# Patient Record
Sex: Female | Born: 1955 | Race: White | Hispanic: No | Marital: Married | Smoking: Never smoker
Health system: Southern US, Community
[De-identification: ages and names within clinical notes are randomized; demographics above are authoritative.]

## PROBLEM LIST (undated history)

## (undated) DIAGNOSIS — I10 Essential (primary) hypertension: Secondary | ICD-10-CM

## (undated) DIAGNOSIS — H269 Unspecified cataract: Secondary | ICD-10-CM

## (undated) DIAGNOSIS — M1990 Unspecified osteoarthritis, unspecified site: Secondary | ICD-10-CM

## (undated) HISTORY — PX: REPLACEMENT TOTAL HIP W/  RESURFACING IMPLANTS: SUR1222

## (undated) HISTORY — DX: Unspecified cataract: H26.9

## (undated) HISTORY — DX: Essential (primary) hypertension: I10

---

## 1999-08-25 HISTORY — PX: ABDOMINAL HYSTERECTOMY: SHX81

## 2003-01-26 ENCOUNTER — Emergency Department (HOSPITAL_COMMUNITY): Admission: EM | Admit: 2003-01-26 | Discharge: 2003-01-26 | Payer: Self-pay | Admitting: Emergency Medicine

## 2003-01-26 ENCOUNTER — Encounter: Payer: Self-pay | Admitting: Emergency Medicine

## 2008-03-23 ENCOUNTER — Ambulatory Visit: Payer: Self-pay | Admitting: Cardiology

## 2009-03-27 ENCOUNTER — Emergency Department (HOSPITAL_COMMUNITY): Admission: EM | Admit: 2009-03-27 | Discharge: 2009-03-27 | Payer: Self-pay | Admitting: Emergency Medicine

## 2010-10-21 ENCOUNTER — Encounter (HOSPITAL_COMMUNITY): Payer: 59

## 2010-10-21 ENCOUNTER — Other Ambulatory Visit: Payer: Self-pay | Admitting: Podiatry

## 2010-10-21 ENCOUNTER — Ambulatory Visit (HOSPITAL_COMMUNITY)
Admission: RE | Admit: 2010-10-21 | Discharge: 2010-10-21 | Disposition: A | Discharge status: Home / Self Care | Payer: 59 | Source: Ambulatory Visit | Attending: Podiatry | Admitting: Podiatry

## 2010-10-21 ENCOUNTER — Other Ambulatory Visit (HOSPITAL_COMMUNITY): Payer: Self-pay | Admitting: Podiatry

## 2010-10-21 DIAGNOSIS — M21079 Valgus deformity, not elsewhere classified, unspecified ankle: Secondary | ICD-10-CM

## 2010-10-21 DIAGNOSIS — M2010 Hallux valgus (acquired), unspecified foot: Secondary | ICD-10-CM | POA: Insufficient documentation

## 2010-10-21 DIAGNOSIS — M2040 Other hammer toe(s) (acquired), unspecified foot: Secondary | ICD-10-CM | POA: Insufficient documentation

## 2010-10-21 DIAGNOSIS — Z01818 Encounter for other preprocedural examination: Secondary | ICD-10-CM | POA: Insufficient documentation

## 2010-10-21 DIAGNOSIS — Z01810 Encounter for preprocedural cardiovascular examination: Secondary | ICD-10-CM | POA: Insufficient documentation

## 2010-10-21 LAB — BASIC METABOLIC PANEL
GFR calc non Af Amer: >60 mL/min (ref >60)
Potassium: 3.8 mEq/L (ref 3.5–5.1)
Sodium: 140 mEq/L (ref 135–145)

## 2010-10-21 LAB — SURGICAL PCR SCREEN
MRSA, PCR: NEGATIVE
Staphylococcus aureus: NEGATIVE

## 2010-10-21 LAB — HEMOGLOBIN AND HEMATOCRIT, BLOOD
HCT: 39.7 % (ref 36.0–46.0)
Hemoglobin: 13.2 g/dL (ref 12.0–15.0)

## 2010-10-23 ENCOUNTER — Ambulatory Visit (HOSPITAL_COMMUNITY): Payer: 59

## 2010-10-23 ENCOUNTER — Other Ambulatory Visit: Payer: Self-pay

## 2010-10-23 ENCOUNTER — Ambulatory Visit (HOSPITAL_COMMUNITY)
Admission: RE | Admit: 2010-10-23 | Discharge: 2010-10-23 | Disposition: A | Discharge status: Home / Self Care | Payer: 59 | Source: Ambulatory Visit | Attending: Podiatry | Admitting: Podiatry

## 2010-10-23 DIAGNOSIS — M2040 Other hammer toe(s) (acquired), unspecified foot: Secondary | ICD-10-CM | POA: Insufficient documentation

## 2010-10-23 DIAGNOSIS — M2010 Hallux valgus (acquired), unspecified foot: Secondary | ICD-10-CM | POA: Insufficient documentation

## 2010-10-23 DIAGNOSIS — Z79899 Other long term (current) drug therapy: Secondary | ICD-10-CM | POA: Insufficient documentation

## 2010-10-23 DIAGNOSIS — I10 Essential (primary) hypertension: Secondary | ICD-10-CM | POA: Insufficient documentation

## 2010-10-23 DIAGNOSIS — Z7982 Long term (current) use of aspirin: Secondary | ICD-10-CM | POA: Insufficient documentation

## 2010-12-08 ENCOUNTER — Other Ambulatory Visit (HOSPITAL_COMMUNITY): Payer: Self-pay | Admitting: Podiatry

## 2010-12-08 ENCOUNTER — Other Ambulatory Visit: Payer: Self-pay | Admitting: Podiatry

## 2010-12-08 ENCOUNTER — Encounter (HOSPITAL_COMMUNITY): Payer: 59

## 2010-12-08 ENCOUNTER — Ambulatory Visit (HOSPITAL_COMMUNITY)
Admission: RE | Admit: 2010-12-08 | Discharge: 2010-12-08 | Disposition: A | Discharge status: Home / Self Care | Payer: 59 | Source: Ambulatory Visit | Attending: Podiatry | Admitting: Podiatry

## 2010-12-08 DIAGNOSIS — M21611 Bunion of right foot: Secondary | ICD-10-CM

## 2010-12-08 DIAGNOSIS — Z01812 Encounter for preprocedural laboratory examination: Secondary | ICD-10-CM | POA: Insufficient documentation

## 2010-12-08 DIAGNOSIS — M2010 Hallux valgus (acquired), unspecified foot: Secondary | ICD-10-CM | POA: Insufficient documentation

## 2010-12-08 LAB — SURGICAL PCR SCREEN
MRSA, PCR: NEGATIVE
Staphylococcus aureus: NEGATIVE

## 2010-12-08 LAB — BASIC METABOLIC PANEL
Calcium: 9.3 mg/dL (ref 8.4–10.5)
Creatinine, Ser: 0.63 mg/dL (ref 0.4–1.2)
GFR calc Af Amer: >60 mL/min (ref >60)
GFR calc non Af Amer: >60 mL/min (ref >60)

## 2010-12-08 LAB — HEMOGLOBIN AND HEMATOCRIT, BLOOD
HCT: 37.8 % (ref 36.0–46.0)
Hemoglobin: 12.4 g/dL (ref 12.0–15.0)

## 2010-12-11 ENCOUNTER — Ambulatory Visit (HOSPITAL_COMMUNITY)
Admission: RE | Admit: 2010-12-11 | Discharge: 2010-12-11 | Disposition: A | Discharge status: Home / Self Care | Payer: 59 | Source: Ambulatory Visit | Attending: Podiatry | Admitting: Podiatry

## 2010-12-11 ENCOUNTER — Ambulatory Visit (HOSPITAL_COMMUNITY): Payer: 59

## 2010-12-11 DIAGNOSIS — I10 Essential (primary) hypertension: Secondary | ICD-10-CM | POA: Insufficient documentation

## 2010-12-11 DIAGNOSIS — M2040 Other hammer toe(s) (acquired), unspecified foot: Secondary | ICD-10-CM | POA: Insufficient documentation

## 2010-12-11 DIAGNOSIS — Z79899 Other long term (current) drug therapy: Secondary | ICD-10-CM | POA: Insufficient documentation

## 2010-12-11 DIAGNOSIS — Z7982 Long term (current) use of aspirin: Secondary | ICD-10-CM | POA: Insufficient documentation

## 2010-12-11 DIAGNOSIS — B351 Tinea unguium: Secondary | ICD-10-CM | POA: Insufficient documentation

## 2010-12-11 DIAGNOSIS — M2010 Hallux valgus (acquired), unspecified foot: Secondary | ICD-10-CM | POA: Insufficient documentation

## 2010-12-11 DIAGNOSIS — M21619 Bunion of unspecified foot: Secondary | ICD-10-CM | POA: Insufficient documentation

## 2010-12-24 NOTE — Op Note (Signed)
NAME:  Amy Cook, BACKES             ACCOUNT NO.:  [id number]  MEDICAL RECORD NO.:  [id number]           PATIENT TYPE:  O  LOCATION:  DAYP                          FACILITY:  [facility]  PHYSICIAN:  B. Theola Sequin, MD   DATE OF BIRTH:  10/03/1955  DATE OF PROCEDURE:  12/11/2010 DATE OF DISCHARGE:                              OPERATIVE REPORT   SURGEON:  B. Theola Sequin, MD  ASSISTANT:  Neva Seat  PREOPERATIVE DIAGNOSES: 1. Hallux valgus, right foot. 2. Hammertoe deformity, second digit, right foot. 3. Hammertoe deformity, third digit, left foot. 4. Tailor bunion, right foot. 5. Onychomycosis, right hallux.  POSTOPERATIVE DIAGNOSES: 1. Hallux valgus, right foot. 2. Hammertoe deformity, second digit, right foot. 3. Hammertoe deformity, third digit, left foot. 4. Tailor bunion, right foot. 5. Onychomycosis, right hallux.  PROCEDURES: 1. Austin bunionectomy of the right foot. 2. Hammertoe repair, second digit, right foot. 3. Hammertoe repair, third digit, left foot. 4. Tailor bunionectomy, right foot. 5. Permanent total nail avulsion of the right hallux.  ANESTHESIA:  MAC with local.  HEMOSTASIS:  Pneumatic ankle tourniquet at 250 mmHg.  ESTIMATED BLOOD LOSS:  Minimal (less than 5 mL).  MATERIALS:  Synthes 3.0 cannulated screw x20 mm in length and 2.0 mm Allofix cortical bone pin x2.  PATHOLOGY:  None.  COMPLICATIONS:  None.  PROCEDURE IN DETAIL:  The patient was brought to the operating room and placed on the operative table in supine position.  Pneumatic ankle tourniquets were placed about both ankles.  Both feet were then scrubbed, prepped, and draped in the usual sterile manner.  Both limbs were then anesthetized using a 0.5% Marcaine plain.  The right foot was elevated, exsanguinated, and the pneumatic ankle tourniquet inflated to 250 mmHg.  Attention was directed to the dorsomedial aspect of the right foot where a linear longitudinal incision was made  medial and parallel to the extensor hallucis longus tendon.  Incision was deepened to the subcutaneous tissue down to the level of the first metatarsophalangeal joint.  A linear longitudinal periosteal and capsular incisions were performed.  Periosteal and capsular structures were reflected medially and laterally thus exposing the head of the first metatarsal at the operative site.  Prominent medial eminence was resected using a power bone saw.  Attention was directed to the first interspace via the original skin incision where the tendon of the adductor hallucis muscle was identified and transected as attachment to the base of the proximal phalanx.  Attention was redirected to the medial aspect of the first metatarsal head where a chevron osteotomy was performed.  The capital fragment was distracted and shifted into a more left lateral position and impacted upon the first metatarsal shaft.  Following standard principles and techniques, a 3.0 Synthes cannulated screw was inserted across the osteotomy site with adequate compression noted.  Position of the screw was confirmed with C-arm fluoroscopy.  The remaining medial bone shelf was resected using a power bone saw.  A medial capsulorrhaphy was then performed to the first metatarsophalangeal joint.  The periosteal and capsular structures were reapproximated using 2-0 and 3-0 Vicryl, subcutaneous structure was reapproximated using 4-0  Vicryl, and the skin was reapproximated using 5-0 Monocryl in a subcuticular manner. Incision was reinforced with Steri-Strips.  Attention was directed to the dorsal aspect of the right second toe where a linear longitudinal incision was made.  Incision was deepened down to the level of the proximal interphalangeal joint and distal interphalangeal joints.  A transverse tenotomy and capsulotomy was performed to the level of the proximal interphalangeal joint and also the distal interphalangeal joint.  The  head of the proximal phalanx, base of the middle phalanx, head of the middle phalanx, and base of the distal phalanx were resected using power bone saw.  Following standard principles and techniques, a 2.0 mm Allofix cortical bone pin was inserted across the PIPJ and DIPJ.  Position was confirmed with C-arm fluoroscopy.  The extensor tendon was reapproximated using 4-0 Vicryl. Skin was reapproximated using 5-0 Monocryl in a subcuticular manner. Incision was reinforced with Steri-Strips.  Attention was directed to the dorsal aspect of the fifth metatarsophalangeal joint and a linear longitudinal incision was made overlying the fifth metatarsophalangeal joint.  Dissection was continued deep down to level of the fifth metatarsophalangeal joint.  A linear longitudinal periosteal incision was performed.  Periosteal and capsular structures were reflected medially and laterally thus exposing the head of the fifth metatarsal at the operative site.  Prominent lateral eminence was resected using a power bone saw.  The wound was irrigated with copious amounts of sterile irrigant.  Deep periosteal and capsular structures were reapproximated using 4-0 Vicryl, subcutaneous structure reapproximated using 4-0 Vicryl, and skin reapproximated using 5-0 Monocryl in a subcuticular manner.  Incision was reinforced with Steri- Strips.  Attention was directed to the right great toe where the nail was avulsed.  The nail was inspected.  No spicules were noted to remain. Using 3 applications of 89% phenol and 30 seconds each, a chemical matrixectomy was performed.  Sterile compressive dressing was then applied to the right foot.  Pneumatic ankle tourniquet was deflated and a prompt hyperemic response was noted to all digits of the right foot.  Attention was then directed to the left foot.  The foot was elevated, exsanguinated, and the pneumatic ankle tourniquet was inflated to 250 mmHg.  Attention was directed  to the dorsal aspect of the third digit of the left foot where a linear longitudinal incision was made.  Same procedure was performed to the left third toe that was performed to the right second toe without omission or alteration.  Sterile compressive dressing was applied.  The pneumatic ankle tourniquet was deflated and a prompt hyperemic response was noted to all digits of the left foot.  The patient tolerated all procedures well and was transferred from the operating room to the Postanesthesia Care Unit with vital signs stable and vascular status intact to all digits of both feet.          ______________________________ B. Theola Sequin, MD     BIM/MEDQ  D:  12/11/2010  T:  12/11/2010  Job:  [id number]  Electronically Signed by Rexford Maus  on 12/24/2010 03:12:33 PM

## 2010-12-24 NOTE — Op Note (Signed)
NAME:  Amy Cook, Amy Cook             ACCOUNT NO.:  [id number]  MEDICAL RECORD NO.:  [id number]           PATIENT TYPE:  O  LOCATION:  DAYP                          FACILITY:  [facility]  PHYSICIAN:  B. Theola Sequin, MD   DATE OF BIRTH:  19-Sep-1955  DATE OF PROCEDURE:  10/23/2010 DATE OF DISCHARGE:                              OPERATIVE REPORT   SURGEON:  B. Theola Sequin, MD  ASSISTANT:  Valetta Close.  PREOPERATIVE DIAGNOSES: 1. Hallux valgus, left foot, ICD-9735.0 2. Hammertoe deformity, second digit, left foot, ICD-9 735.4.  POSTOPERATIVE DIAGNOSES: 1. Hallux valgus, left foot, ICD-9735.0 2. Hammertoe deformity, second digit, left foot, ICD-9 735.4.  PROCEDURES: 1. Austin bunionectomy of the left foot, CPT code 29562. 2. Hammertoe repair of the second digit, left foot, CPT code 13086  ANESTHESIA:  MAC with local.  HEMOSTASIS:  Pneumatic ankle tourniquet at 250 mmHg.  ESTIMATED BLOOD LOSS:  Minimal (less than 5 mL).  MATERIALS:  Synthes 3.0 cannulated screw and a 2.0-mm Allofix cortical bone pin.  PATHOLOGY:  Bone from first metatarsal for gross evaluation, left foot.  COMPLICATIONS:  None.  PROCEDURE IN DETAIL:  The patient was brought to the operating room and placed on the operative table in supine position.  Pneumatic ankle tourniquet was placed about the patient's left ankle.  The foot was anesthetized using 0.5% Marcaine plain.  The foot was scrubbed, prepped, and draped in the usual sterile manner.  The limb was elevated and exsanguinated and the pneumatic ankle tourniquet was inflated to e50 mmHg.  Attention was directed to the dorsomedial aspect of the left foot where curvilinear incision was made medial and parallel to the extensor hallucis longus tendon.  Dissection was continued deep down to level of the first metatarsophalangeal joint.  A linear longitudinal periosteal capsular incision was performed.  Periosteal and capsular structures were reflected  medially and laterally, thus exposing the head of the first metatarsal at the operative site.  Prominent medial eminence of the first metatarsal head was resected using a power bone saw. Attention was directed to the first interspace via the original skin incision where the tendon of the adductor hallucis muscle was identified and transected as attachment to the base of the proximal phalanx. Attention was redirected to the medial aspect of the first metatarsal head where a Chevron osteotomy was performed.  Capital fragment was distracted and shifted into more lateral position.  Following standard principles and techniques, a 3.0-mm Synthes cannulated screw was inserted across the osteotomy site with adequate compression noted.  The remaining medial bone shelf was resected using power bone saw.  A medial capsulorrhaphy was then performed and reapproximated using 2-0 Vicryl. Periosteal and capsular structures were reapproximated using 4-0 Vicryl. Subcutaneous structure was reapproximated using 4-0 Vicryl and skin was reapproximated using 5-0 Monocryl in a running subcuticular manner. Incision was reinforced with Steri-Strips.  Attention was directed to the second digit of the left foot where a linear longitudinal incision was performed extending just proximal to the nail fold proximally to the second base of the second base proximal phalanx second digit.  Dissection was continued deep down to level  of the proximal interphalangeal joint.  A transverse tenotomy and capsulotomy was performed.  The corresponding articular surfaces of the head of the proximal phalanx and base of the middle phalanx were resected and passed from the operative field.  Attention was directed to the distal interphalangeal joint.  A transverse tenotomy and capsulotomy was performed.  Articular surface was resected from the head of the middle phalanx and base of the distal phalanx.  A 2.0 Allofix cortical bone pin was  then inserted crossing both the distal interphalangeal joint and proximal interphalangeal joints.  Position of the toe was evaluated and noted to be in good alignment.  Prior to any wound closure, the wound was irrigated with copious amounts of sterile irrigant.  The extensor tendon was reapproximated using 4-0 Vicryl. Subcutaneous structure was reapproximated using 4-0 Vicryl.  Skin was reapproximated using 5-0 Monocryl in a subcuticular manner and reinforced with 4-0 Prolene in horizontal mattress suture technique. Incision was reinforced with Steri-Strips.  A sterile compressive dressing was applied to the left foot.  The pneumatic ankle tourniquet was deflated and prompt hyperemic response was noted to all digits of the left foot.          ______________________________ B. Theola Sequin, MD     BIM/MEDQ  D:  10/23/2010  T:  10/24/2010  Job:  [id number]  Electronically Signed by Rexford Maus  on 12/24/2010 03:12:01 PM

## 2011-01-06 NOTE — Assessment & Plan Note (Signed)
[facility]                       [place] CARDIOLOGY OFFICE NOTE   Amy Cook, Amy Cook                      MRN:          [id number]  DATE:03/23/2008                            DOB:          04/03/1956    Amy Cook was seen in the office today at her request.  She has a  history of coronary disease and recently moved to the [place] area.  We already follow a number of her family members.  She first presented  in 2004 with myocardial infarction and severe left ventricular  dysfunction.  At followup, she was noted to have marked recovery in  systolic function.  She had a cardiac catheterization at the time of her  MI that showed moderate disease.  She subsequently did well, but decided  to go to the [facility] for reassessment.  Her LAD and diagonal  were treated percutaneously and a drug-eluting stents implanted.  She  was continued on Plavix and aspirin and done fine since that procedure.   Contributors to her cardiovascular disease include hypertension and  dyslipidemia.  She took a statin for a while, but discontinued it due to  leg pain and concerned over the possible adverse effects.  Blood  pressure control is good when she takes her medicine, which is not all  the time.   Otherwise, she has been generally healthy.  She has a history of GERD.  Most of her medical interventions have been gynecologic including 3  cesarean sections, hysterectomy in 2001 and a right breast biopsy.   ALLERGIES:  She reports an allergy to MORPHINE.   CURRENT MEDICATIONS:  Clopidogrel 75 mg daily, Toprol 25 mg daily,  Benicar 20 mg daily, fish oil 1000 mg daily, and aspirin 81 mg daily.   SOCIAL HISTORY:  Now works at the [employer] as a Lawyer.  Walks for  exercise.  Divorced with 3 children.   FAMILY HISTORY:  Positive for a fatal case of meningitis in her father  and congestive heart failure in her mother.  She has 8 siblings, only  one of whom  has cardiac issues.   REVIEW OF SYSTEMS:  The need for corrective lenses for near and far  vision.  All other systems reviewed and are negative.   PHYSICAL EXAMINATION:  GENERAL:  On exam, trim pleasant woman in no  acute distress.  VITAL SIGNS:  The weight is 150 pounds, blood pressure 155/95, heart  rate 60 and regular, and respirations 14.  NECK:  No jugular venous distention.  No carotid bruits.  HEENT:  EOMs are full.  Normal lids and conjunctivae.  ENDOCRINE:  No thyromegaly.  HEMATOPOIETIC:  No adenopathy.  LUNGS:  Clear.  BACK:  Straight.  CARDIAC:  Normal first and second heart sounds.  Normal PMI.  ABDOMEN:  Soft and nontender.  No organomegaly.  EXTREMITIES:  No edema.  Normal distal pulses.   No recent laboratory is available   IMPRESSION:  Amy Cook's history is most consistent with Takotsubos as  her initial presentation was associated moderate coronary disease.  Although, I do not have records from the [facility], it  sounds as  if stenting of her coronaries was not absolutely necessary.  Nonetheless, she has continued to do well.  We discussed the pros and  cons of clopidogrel.  Due to expense, she wishes to discontinue this  medication, which is fine with me.  She would like her other medications  to be as inexpensive as possible.  Benicar will be changed to lisinopril  20 mg daily.  We will start pravastatin 20 mg daily.  She is cautioned  to call for any side effects.  A lipid profile and chemistry profile  will be obtained in 1 month with a return visit in 6 months.  We will  seek results of her recent evaluation at [facility].     Amy Friends. Dietrich Pates, MD, [facility]  Electronically Signed    RMR/MedQ  DD: 03/23/2008  DT: 03/24/2008  Job #: [id number]

## 2011-01-09 NOTE — Procedures (Signed)
   Amy [name], HAVENER                         ACCOUNT NO.:  [id number]   MEDICAL RECORD NO.:  [id number]                   PATIENT TYPE:  EMS   LOCATION:  ED                                   FACILITY:  [facility]   PHYSICIAN:  Edward L. Juanetta Gosling, M.D.             DATE OF BIRTH:  [birth year]   DATE OF PROCEDURE:  02/06/2003  DATE OF DISCHARGE:  01/26/2003                                EKG INTERPRETATION   RESULTS:  The rhythm is sinus rhythm with a rate in the 60's.  There are  inferolateral T-wave abnormalities which are nonspecific.  Abnormal  electrocardiogram.                                               Oneal Deputy. Juanetta Gosling, M.D.    ELH/MEDQ  D:  02/06/2003  T:  02/06/2003  Job:  [id number]

## 2011-01-09 NOTE — Procedures (Signed)
   [name], [name]                         ACCOUNT NO.:  [id number]   MEDICAL RECORD NO.:  [id number]                   PATIENT TYPE:  EMS   LOCATION:  ED                                   FACILITY:  [facility]   PHYSICIAN:  Edward L. Juanetta Gosling, M.D.             DATE OF BIRTH:  April 28, 1956   DATE OF PROCEDURE:  DATE OF DISCHARGE:  01/26/2003                                EKG INTERPRETATION   TIME AND DATE:  1847 on 01/26/2003.   INTERPRETATION:  The rhythm is sinus rhythm with a rate in the 60s.  There  are diffuse nonspecific T wave abnormalities.  Slow R wave progression  across the precordium may indicate previous anterior myocardial infarction  and clinical correlation is suggested.   IMPRESSION:  Abnormal electrocardiogram.                                               Edward L. Juanetta Gosling, M.D.    ELH/MEDQ  D:  01/31/2003  T:  02/01/2003  Job:  [id number]

## 2013-01-25 IMAGING — CR DG FOOT COMPLETE 3+V*L*
3 series · 3 of 3 positions shown · non-contrast
Comparison: 10/21/2010.

CLINICAL DATA: Postop bunionectomy.

LEFT FOOT - COMPLETE 3+ VIEW

[view not recorded (1 of 3)]
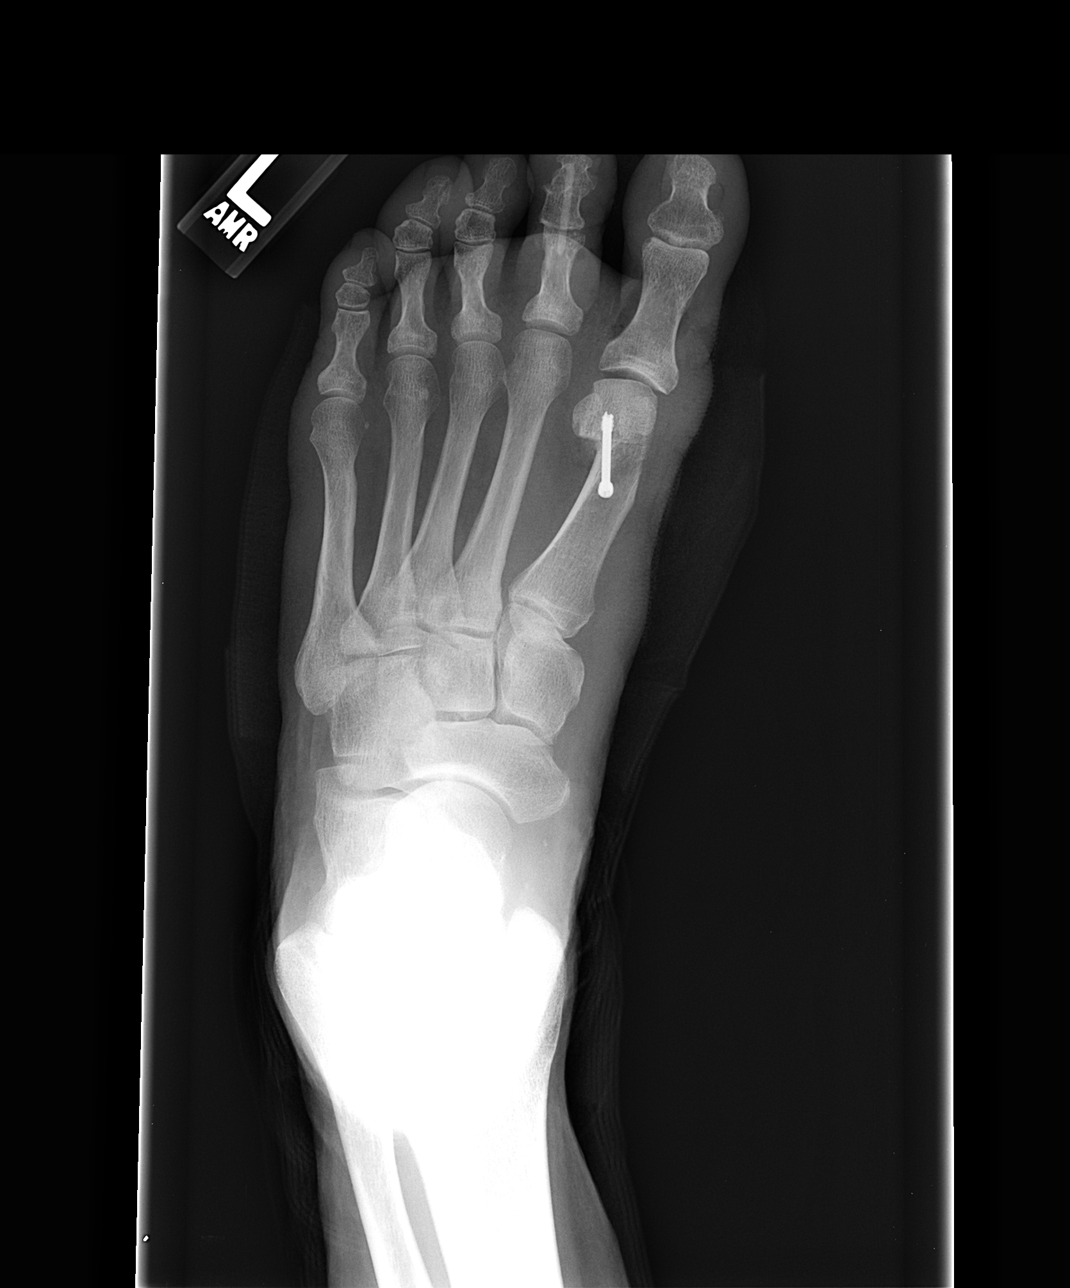

[view not recorded (2 of 3)]
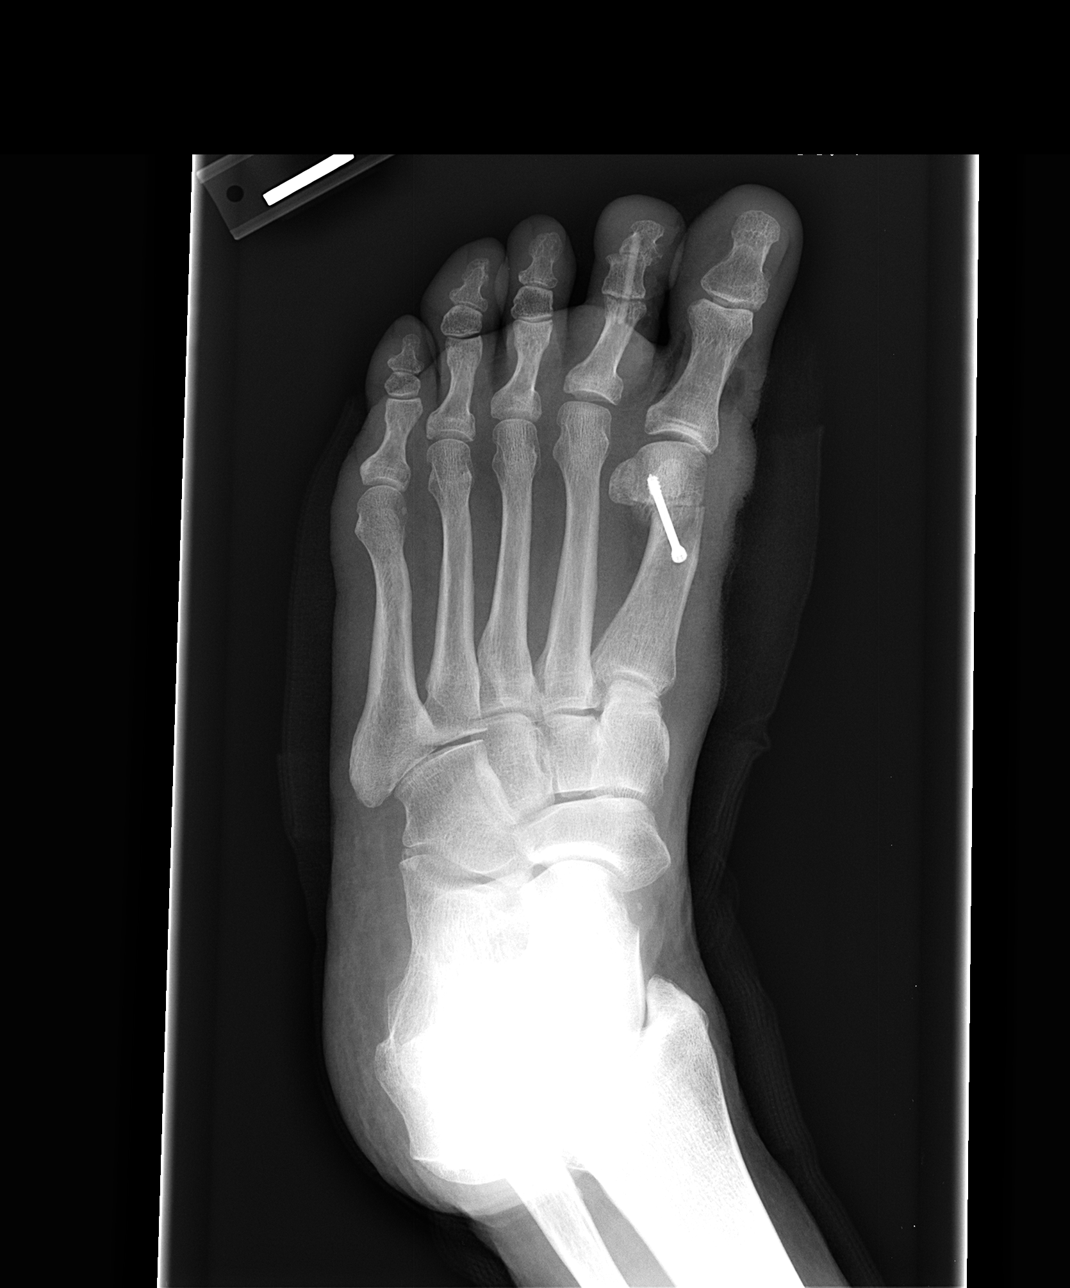

[view not recorded (3 of 3)]
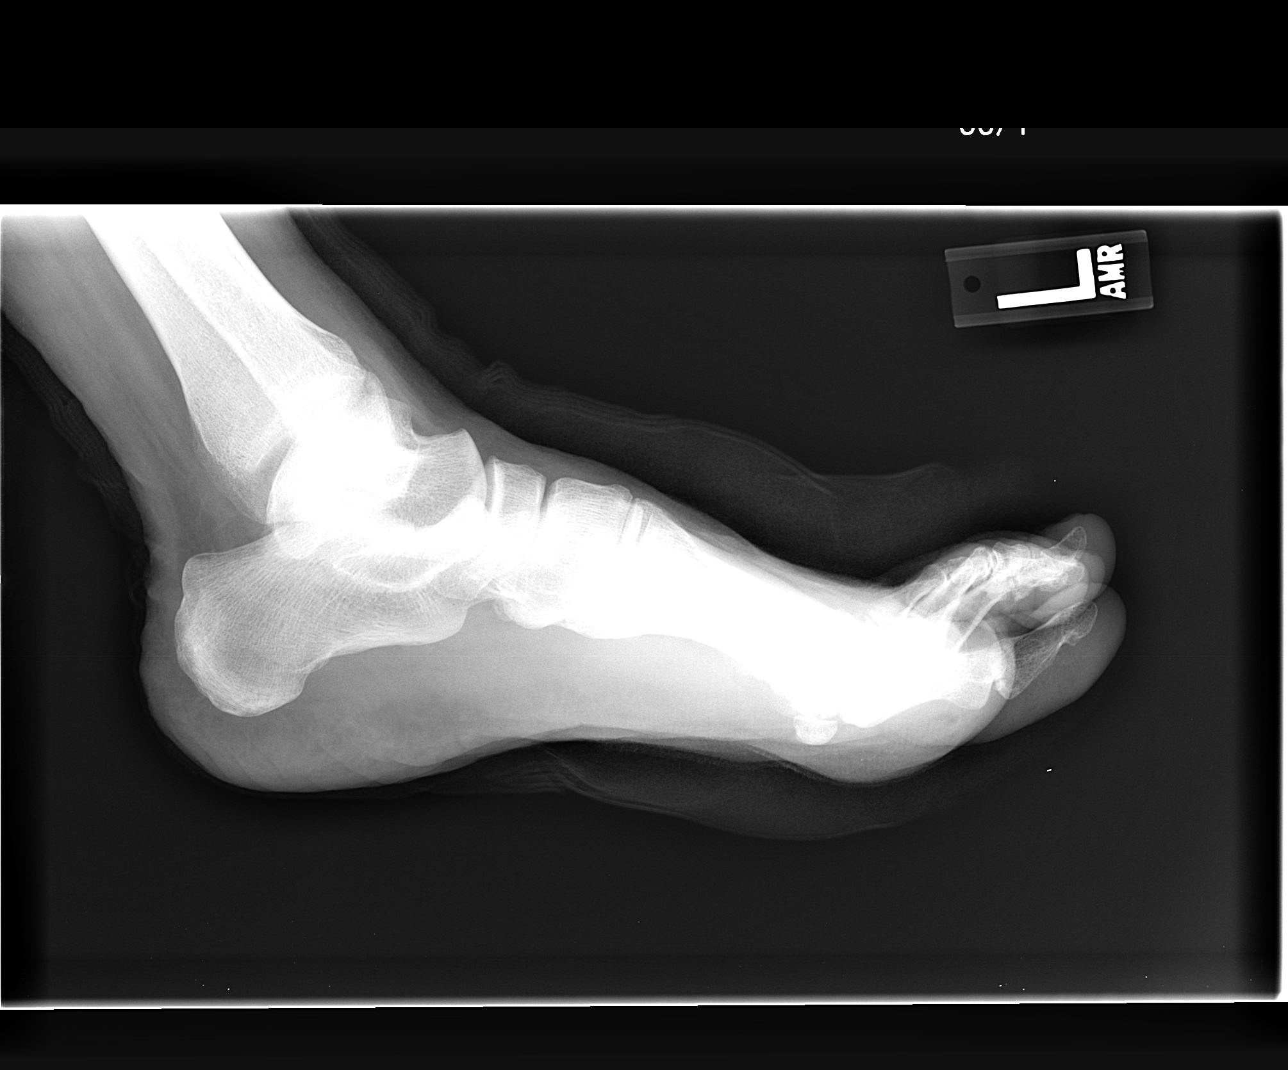

[3 of 3 positions shown; findings below may reference images not displayed]

FINDINGS: There are postsurgical changes status post bunionectomy
and osteotomy of the head of the first metatarsal.  Fixation screw
appears well positioned.  There is no evidence of acute fracture or
dislocation.  The patient appears to have undergone arthrodesis
across the middle and distal phalanges of the second toe as well.
There is some soft tissue emphysema within the great toe.
IMPRESSION: Postsurgical changes as described.  No demonstrated complication.

## 2016-12-22 DIAGNOSIS — H269 Unspecified cataract: Secondary | ICD-10-CM

## 2016-12-22 HISTORY — DX: Unspecified cataract: H26.9

## 2017-04-12 ENCOUNTER — Encounter: Payer: Self-pay | Admitting: Physician Assistant

## 2017-04-12 ENCOUNTER — Ambulatory Visit (INDEPENDENT_AMBULATORY_CARE_PROVIDER_SITE_OTHER): Payer: Self-pay | Admitting: Physician Assistant

## 2017-04-12 VITALS — BP 160/110 | HR 67 | Temp 97.6°F | Resp 18 | Ht 63.0 in | Wt 153.4 lb

## 2017-04-12 DIAGNOSIS — I10 Essential (primary) hypertension: Secondary | ICD-10-CM | POA: Insufficient documentation

## 2017-04-12 DIAGNOSIS — Z0000 Encounter for general adult medical examination without abnormal findings: Secondary | ICD-10-CM

## 2017-04-12 LAB — CBC WITH DIFFERENTIAL/PLATELET
BASOS ABS: 0 {cells}/uL (ref 0–200)
Basophils Relative: 0 %
EOS ABS: 324 {cells}/uL (ref 15–500)
Eosinophils Relative: 6 %
HCT: 39.4 % (ref 35.0–45.0)
Hemoglobin: 13.2 g/dL (ref 12.0–15.0)
LYMPHS PCT: 37 %
Lymphs Abs: 1998 cells/uL (ref 850–3900)
MCH: 29.6 pg (ref 27.0–33.0)
MCHC: 33.5 g/dL (ref 32.0–36.0)
MCV: 88.3 fL (ref 80.0–100.0)
MPV: 9.2 fL (ref 7.5–12.5)
Monocytes Absolute: 378 cells/uL (ref 200–950)
Monocytes Relative: 7 %
NEUTROS ABS: 2700 {cells}/uL (ref 1500–7800)
NEUTROS PCT: 50 %
Platelets: 305 10*3/uL (ref 140–400)
RBC: 4.46 MIL/uL (ref 3.80–5.10)
RDW: 14 % (ref 11.0–15.0)
WBC: 5.4 10*3/uL (ref 3.8–10.8)

## 2017-04-12 LAB — TSH: TSH: 2.45 mIU/L

## 2017-04-12 MED ORDER — AMLODIPINE BESYLATE 5 MG PO TABS: 5.0000 mg | ORAL_TABLET | Freq: Every day | ORAL | 0 refills | Status: DC

## 2017-04-12 NOTE — Progress Notes (Addendum)
Patient ID: Amy Cook MRN: [id number], DOB: [birth year], 61 y.o. Date of Encounter: 04/12/2017,   Chief Complaint: Physical (CPE)  HPI: 61 y.o. y/o female  here for CPE.    She presents as a new patient to our office. Her husband also has an appointment with me today as a new patient to establish care. Also I have been seeing her daughter and her daughter's family (last name Pontis)  I reviewed with her that the daughter and her family moved here from [place] about 1-2 years ago. Asked patient if she had always been living here or had been living in [place] etc. She reports that she had been back and forth between [place] and [place] for many years. She got separated from her first husband in 2003. She moved here in 2008. She got remarried in 2012. Her current husband works in [employer]. He now lives with her in [place]. After her divorce, she moved to [place] where she has multiple siblings and a farm. She has been keeping her grand children--- the ones who are not yet school-age and then also has been keeping all of them during the summer. She stays very active with them. 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  She has no medical insurance. She has seen no medical provider in many years.  The only specific medical issue that she brings up is that she was having symptoms consistent with vaginal atrophy but has been using lubricant and this controls the symptoms. Today I did discuss using an estrogen cream but the fact that this would be more expensive and so if the lubricant is controlling symptoms from a financial standpoint will just continue that.  She has had hysterectomy and bilateral oophorectomy in the past but this was not due to cancer.  She has no other significant history and has no other specific concerns to address today.    Review of Systems: Consitutional: No fever, chills, fatigue, night  sweats, lymphadenopathy. No significant/unexplained weight changes. Eyes: No visual changes, eye redness, or discharge. ENT/Mouth: No ear pain, sore throat, nasal drainage, or sinus pain. Cardiovascular: No chest pressure,heaviness, tightness or squeezing, even with exertion. No increased shortness of breath or dyspnea on exertion.No palpitations, edema, orthopnea, PND. Respiratory: No cough, hemoptysis, SOB, or wheezing. Gastrointestinal: No anorexia, dysphagia, reflux, pain, nausea, vomiting, hematemesis, diarrhea, constipation, BRBPR, or melena. Breast: No mass, nodules, bulging, or retraction. No skin changes or inflammation. No nipple discharge. No lymphadenopathy. Genitourinary: No dysuria, hematuria, incontinence, vaginal discharge, pruritis, burning, abnormal bleeding, or pain. Musculoskeletal: No decreased ROM, No joint pain or swelling. No significant pain in neck, back, or extremities. Skin: No rash, pruritis, or concerning lesions. Neurological: No headache, dizziness, syncope, seizures, tremors, memory loss, coordination problems, or paresthesias. Psychological: No anxiety, depression, hallucinations, SI/HI. Endocrine: No polydipsia, polyphagia, polyuria, or known diabetes.No increased fatigue. No palpitations/rapid heart rate. No significant/unexplained weight change. All other systems were reviewed and are otherwise negative.  Past Medical History:  Diagnosis Date  . Cataract 12/2016   both  . Hypertension      Past Surgical History:  Procedure Laterality Date  . ABDOMINAL HYSTERECTOMY  2001   complete  . CESAREAN SECTION      Home Meds:  No outpatient prescriptions prior to visit.   No facility-administered medications prior to visit.     Allergies: No Known Allergies  Social History   Social History  . Marital  status: Married    Spouse name: N/A  . Number of children: N/A  . Years of education: N/A   Occupational History  . Not on file.   Social  History Main Topics  . Smoking status: Never Smoker  . Smokeless tobacco: Never Used  . Alcohol use No  . Drug use: No  . Sexual activity: Yes   Other Topics Concern  . Not on file   Social History Narrative  . No narrative on file    Family History  Problem Relation Age of Onset  . Depression Mother   . Hypertension Mother   . Hyperlipidemia Mother   . Stroke Mother   . Alcohol abuse Father   . Heart disease Father   . Hypertension Father   . Hyperlipidemia Father   . COPD Sister   . Depression Sister   . Alcohol abuse Brother   . Depression Brother   . Depression Daughter   . Alcohol abuse Maternal Uncle   . Alcohol abuse Paternal Uncle   . Alcohol abuse Brother   . Depression Brother     Physical Exam: Blood pressure (!) 160/110, pulse 67, temperature 97.6 F (36.4 C), temperature source Oral, resp. rate 18, height 5\' 3"  (1.6 m), weight 153 lb 6.4 oz (69.6 kg), SpO2 98 %., Body mass index is 27.17 kg/m. General: Well developed, well nourished WF. Appears in no acute distress. HEENT: Normocephalic, atraumatic. Conjunctiva pink, sclera non-icteric. Pupils 2 mm constricting to 1 mm, round, regular, and equally reactive to light and accomodation. EOMI. Internal auditory canal clear. TMs with good cone of light and without pathology. Nasal mucosa pink. Nares are without discharge. No sinus tenderness. Oral mucosa pink.  Pharynx without exudate.   Neck: Supple. Trachea midline. No thyromegaly. Full ROM. No lymphadenopathy.No Carotid Bruits. Lungs: Clear to auscultation bilaterally without wheezes, rales, or rhonchi. Breathing is of normal effort and unlabored. Cardiovascular: RRR with S1 S2. No murmurs, rubs, or gallops. Distal pulses 2+ symmetrically. No carotid or abdominal bruits. Breast: Symmetrical. No masses. Nipples without discharge. Abdomen: Soft, non-tender, non-distended with normoactive bowel sounds. No hepatosplenomegaly or masses. No rebound/guarding. No CVA  tenderness. No hernias.  Genitourinary: She defers. H/O Hysterectomy and Bilateral Oophorectomy Musculoskeletal: Full range of motion and 5/5 strength throughout.  Skin: Warm and moist without erythema, ecchymosis, wounds, or rash. Neuro: A+Ox3. CN II-XII grossly intact. Moves all extremities spontaneously. Full sensation throughout. Normal gait.  Psych:  Responds to questions appropriately with a normal affect.   Assessment/Plan:  61 y.o. y/o female here for CPE  --------SELF PAY---------NO INSURANCE------------------------ 1. Encounter for medical examination to establish care  2. Encounter for preventive health examination  A. Screening Labs: She is fasting. Did discuss cost. Lab will mark as self pay to see if lab will give her a price deduction. - CBC with Differential/Platelet - COMPLETE METABOLIC PANEL WITH GFR - Lipid panel - TSH   B. Pap: She has had hysterectomy and this was not secondary to cancer so she has no indication for any further Pap smear.  C. Screening Mammogram: Today I did give her a coupon for mammogram for $99 at [facility].  D. DEXA/BMD:  She defers this secondary to cost.  E. Colorectal Cancer Screening: Discussed recommendations for screening for colorectal cancer. Discussed that the best test for this is colonoscopy. She defers this secondary to cost. Also discussed Hemoccult test. However she defers this as well. She does report that there is no family history of colorectal cancer.  We have discussed risk and benefits of the test but she still defers.  F. Immunizations:  Influenza: N/A it is August Tetanus: She reports that she did receive tetanus vaccine at work last year. Pneumococcal: She has no indication to require pneumonia vaccine until age 46. Shingrix: Defers secondary to cost.   3. Essential hypertension Her blood pressure is elevated. I will use Norvasc as this medication will be an expensive and will not require routine lab monitoring.  She is to start this medication daily and return for follow-up visit in 2 weeks to recheck blood pressure on medication. Once the dose is stable, will change Rx to #90. - amLODipine (NORVASC) 5 MG tablet; Take 1 tablet (5 mg total) by mouth daily.  Dispense: 30 tablet; Refill: 0   Signed, [address] [place], [place], [facility] 04/12/2017 9:19 AM

## 2017-04-13 LAB — COMPLETE METABOLIC PANEL WITH GFR
ALT: 15 U/L (ref 6–29)
AST: 17 U/L (ref 10–35)
Albumin: 4.7 g/dL (ref 3.6–5.1)
Alkaline Phosphatase: 98 U/L (ref 33–130)
BILIRUBIN TOTAL: 0.4 mg/dL (ref 0.2–1.2)
BUN: 10 mg/dL (ref 7–25)
CO2: 27 mmol/L (ref 20–32)
Calcium: 9.7 mg/dL (ref 8.6–10.4)
Chloride: 102 mmol/L (ref 98–110)
Creat: 0.65 mg/dL (ref 0.50–0.99)
Glucose, Bld: 90 mg/dL (ref 70–99)
Potassium: 4.5 mmol/L (ref 3.5–5.3)
Sodium: 139 mmol/L (ref 135–146)
TOTAL PROTEIN: 7.6 g/dL (ref 6.1–8.1)

## 2017-04-13 LAB — LIPID PANEL
Cholesterol: 235 mg/dL — ABNORMAL HIGH (ref <200)
HDL: 73 mg/dL (ref >50)
LDL Cholesterol: 142 mg/dL — ABNORMAL HIGH (ref <100)
TRIGLYCERIDES: 100 mg/dL (ref <150)
Total CHOL/HDL Ratio: 3.2 Ratio (ref <5.0)
VLDL: 20 mg/dL (ref <30)

## 2017-04-28 ENCOUNTER — Ambulatory Visit (INDEPENDENT_AMBULATORY_CARE_PROVIDER_SITE_OTHER): Payer: Self-pay | Admitting: Physician Assistant

## 2017-04-28 ENCOUNTER — Telehealth: Payer: Self-pay | Admitting: Physician Assistant

## 2017-04-28 ENCOUNTER — Encounter: Payer: Self-pay | Admitting: Physician Assistant

## 2017-04-28 VITALS — BP 160/88 | HR 60 | Temp 97.6°F | Resp 14 | Ht 63.0 in | Wt 157.0 lb

## 2017-04-28 DIAGNOSIS — I10 Essential (primary) hypertension: Secondary | ICD-10-CM

## 2017-04-28 MED ORDER — AMLODIPINE BESYLATE 10 MG PO TABS: 10.0000 mg | ORAL_TABLET | Freq: Every day | ORAL | 0 refills | Status: DC

## 2017-04-28 MED ORDER — AMLODIPINE BESYLATE 10 MG PO TABS: 10.0000 mg | ORAL_TABLET | Freq: Every day | ORAL | 1 refills | Status: DC

## 2017-04-28 NOTE — Telephone Encounter (Signed)
Medication called/sent to requested pharmacy  

## 2017-04-28 NOTE — Progress Notes (Signed)
Patient ID: Amy Cook MRN: [id number], DOB: 1955-12-31, 61 y.o. Date of Encounter: 04/28/2017,   Chief Complaint: Hypertension  HPI: 61 y.o. y/o female  here for f/u of Hypertension.   04/12/2017: She presents as a new patient to our office. Her husband also has an appointment with me today as a new patient to establish care. Also I have been seeing her daughter and her daughter's family (last name Pontis)  I reviewed with her that the daughter and her family moved here from [place] about 1-2 years ago. Asked patient if she had always been living here or had been living in [place] etc. She reports that she had been back and forth between [place] and [place] for many years. She got separated from her first husband in 2003. She moved here in 2008. She got remarried in 2012. Her current husband works in [employer]. He now lives with her in [place]. After her divorce, she moved to [place] where she has multiple siblings and a farm. She has been keeping her grand children--- the ones who are not yet school-age and then also has been keeping all of them during the summer. She stays very active with them. 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  She has no medical insurance. She has seen no medical provider in many years.  The only specific medical issue that she brings up is that she was having symptoms consistent with vaginal atrophy but has been using lubricant and this controls the symptoms. Today I did discuss using an estrogen cream but the fact that this would be more expensive and so if the lubricant is controlling symptoms from a financial standpoint will just continue that.  She has had hysterectomy and bilateral oophorectomy in the past but this was not due to cancer.  She has no other significant history and has no other specific concerns to address today.  AT THAT OV 04/12/2017: Discussed Preventive Care. Did  screening labs.  BP was elevated at 160/110. Started Norvasc 5mg  QD.    04/28/2017: Today patient reports that she has been taking the Norvasc 5 mg daily. She is having no lower extremity edema. Having no other adverse effects. States that she has checked her blood pressure at home and has gotten around 160/90. No other concerns to address today.    Review of Systems: Consitutional: No fever, chills, fatigue, night sweats, lymphadenopathy. No significant/unexplained weight changes. Eyes: No visual changes, eye redness, or discharge. ENT/Mouth: No ear pain, sore throat, nasal drainage, or sinus pain. Cardiovascular: No chest pressure,heaviness, tightness or squeezing, even with exertion. No increased shortness of breath or dyspnea on exertion.No palpitations, edema, orthopnea, PND. Respiratory: No cough, hemoptysis, SOB, or wheezing. Gastrointestinal: No anorexia, dysphagia, reflux, pain, nausea, vomiting, hematemesis, diarrhea, constipation, BRBPR, or melena. Breast: No mass, nodules, bulging, or retraction. No skin changes or inflammation. No nipple discharge. No lymphadenopathy. Genitourinary: No dysuria, hematuria, incontinence, vaginal discharge, pruritis, burning, abnormal bleeding, or pain. Musculoskeletal: No decreased ROM, No joint pain or swelling. No significant pain in neck, back, or extremities. Skin: No rash, pruritis, or concerning lesions. Neurological: No headache, dizziness, syncope, seizures, tremors, memory loss, coordination problems, or paresthesias. Psychological: No anxiety, depression, hallucinations, SI/HI. Endocrine: No polydipsia, polyphagia, polyuria, or known diabetes.No increased fatigue. No palpitations/rapid heart rate. No significant/unexplained weight change. All other systems were reviewed and are otherwise negative.  Past Medical History:  Diagnosis Date  . Cataract  12/2016   both  . Hypertension      Past Surgical History:  Procedure Laterality  Date  . ABDOMINAL HYSTERECTOMY  2001   complete  . CESAREAN SECTION      Home Meds:  Outpatient Medications Prior to Visit  Medication Sig Dispense Refill  . amLODipine (NORVASC) 5 MG tablet Take 1 tablet (5 mg total) by mouth daily. 30 tablet 0   No facility-administered medications prior to visit.     Allergies: No Known Allergies  Social History   Social History  . Marital status: Married    Spouse name: N/A  . Number of children: N/A  . Years of education: N/A   Occupational History  . Not on file.   Social History Main Topics  . Smoking status: Never Smoker  . Smokeless tobacco: Never Used  . Alcohol use No  . Drug use: No  . Sexual activity: Yes   Other Topics Concern  . Not on file   Social History Narrative  . No narrative on file    Family History  Problem Relation Age of Onset  . Depression Mother   . Hypertension Mother   . Hyperlipidemia Mother   . Stroke Mother   . Alcohol abuse Father   . Heart disease Father   . Hypertension Father   . Hyperlipidemia Father   . COPD Sister   . Depression Sister   . Alcohol abuse Brother   . Depression Brother   . Depression Daughter   . Alcohol abuse Maternal Uncle   . Alcohol abuse Paternal Uncle   . Alcohol abuse Brother   . Depression Brother     Physical Exam: Blood pressure (!) 160/88, pulse 60, temperature 97.6 F (36.4 C), temperature source Oral, resp. rate 14, height 5\' 3"  (1.6 m), weight 157 lb (71.2 kg), SpO2 98 %., Body mass index is 27.81 kg/m. General: Well developed, well nourished WF. Appears in no acute distress. Neck: Supple. Trachea midline. No thyromegaly. Full ROM. No lymphadenopathy.No Carotid Bruits. Lungs: Clear to auscultation bilaterally without wheezes, rales, or rhonchi. Breathing is of normal effort and unlabored. Cardiovascular: RRR with S1 S2. No murmurs, rubs, or gallops. Distal pulses 2+ symmetrically. No carotid or abdominal bruits. Musculoskeletal: Full range of  motion and 5/5 strength throughout.  Skin/Extremities: No LE edema. Neuro: A+Ox3. CN II-XII grossly intact. Moves all extremities spontaneously. Full sensation throughout. Normal gait.  Psych:  Responds to questions appropriately with a normal affect.   Assessment/Plan:  61 y.o. y/o female here for CPE  --------SELF PAY---------NO INSURANCE------------------------  Essential hypertension 04/12/2017:  Her blood pressure is elevated.           I will use Norvasc as this medication will be an expensive and will not require routine lab monitoring.           She is to start this medication daily and return for follow-up visit in 2 weeks to recheck blood pressure on medication. Once the dose is stable, will change Rx to #90. 04/28/2017: Her blood pressure is still elevated. Will increase Norvasc to 10 mg daily.        Told her to write down blood pressure readings from home and bring in to next visit.       Follow-up OV 2 weeks.       (Reports she is going on a cruise next week!!)       Once the dose is stable, will change Rx to #90. - amLODipine (NORVASC) 10  MG tablet; Take 1 tablet (10 mg total) by mouth daily.  Dispense: 30 tablet; Refill: 0   THE FOLLOWING IS COPIED FROM HER CPE 04/12/2017: Encounter for preventive health examination A. Screening Labs: She is fasting. Did discuss cost. Lab will mark as self pay to see if lab will give her a price deduction. - CBC with Differential/Platelet - COMPLETE METABOLIC PANEL WITH GFR - Lipid panel - TSH   B. Pap: She has had hysterectomy and this was not secondary to cancer so she has no indication for any further Pap smear.  C. Screening Mammogram: Today I did give her a [facility].  D. DEXA/BMD:  She defers this secondary to cost.  E. Colorectal Cancer Screening: Discussed recommendations for screening for colorectal cancer. Discussed that the best test for this is colonoscopy. She defers this secondary to  cost. Also discussed Hemoccult test. However she defers this as well. She does report that there is no family history of colorectal cancer. We have discussed risk and benefits of the test but she still defers.  F. Immunizations:  Influenza: N/A it is August Tetanus: She reports that she did receive tetanus vaccine at work last year. Pneumococcal: She has no indication to require pneumonia vaccine until age 61. Shingrix: Defers secondary to cost.    Signed, [address]Miachel Nardelli [place], [place], [facility] 04/28/2017 12:06 PM

## 2017-04-28 NOTE — Telephone Encounter (Signed)
Pt wants us to call the norvasc in to [facility] in [place].

## 2017-05-13 ENCOUNTER — Encounter: Payer: Self-pay | Admitting: Physician Assistant

## 2017-05-20 ENCOUNTER — Ambulatory Visit: Payer: Self-pay | Admitting: Physician Assistant

## 2018-09-08 ENCOUNTER — Encounter: Payer: Self-pay | Admitting: Physician Assistant

## 2018-09-08 ENCOUNTER — Ambulatory Visit: Payer: Self-pay | Admitting: Physician Assistant

## 2018-09-08 VITALS — BP 180/120 | HR 80 | Temp 98.1°F | Resp 20 | Ht 63.0 in | Wt 164.0 lb

## 2018-09-08 DIAGNOSIS — H6692 Otitis media, unspecified, left ear: Secondary | ICD-10-CM

## 2018-09-08 DIAGNOSIS — R011 Cardiac murmur, unspecified: Secondary | ICD-10-CM

## 2018-09-08 DIAGNOSIS — R05 Cough: Secondary | ICD-10-CM

## 2018-09-08 DIAGNOSIS — I10 Essential (primary) hypertension: Secondary | ICD-10-CM

## 2018-09-08 DIAGNOSIS — R059 Cough, unspecified: Secondary | ICD-10-CM

## 2018-09-08 MED ORDER — FLUTICASONE PROPIONATE 50 MCG/ACT NA SUSP: 2.0000 | Freq: Every day | NASAL | 0 refills | Status: DC

## 2018-09-08 MED ORDER — BENZONATATE 200 MG PO CAPS: 200.0000 mg | ORAL_CAPSULE | Freq: Three times a day (TID) | ORAL | 0 refills | Status: DC | PRN

## 2018-09-08 MED ORDER — AMOXICILLIN-POT CLAVULANATE 875-125 MG PO TABS: 1.0000 | ORAL_TABLET | Freq: Two times a day (BID) | ORAL | 0 refills | Status: AC

## 2018-09-08 MED ORDER — FLUCONAZOLE 150 MG PO TABS: ORAL_TABLET | ORAL | 0 refills | Status: DC

## 2018-09-08 MED ORDER — LORATADINE 10 MG PO TABS: 10.0000 mg | ORAL_TABLET | Freq: Every day | ORAL | 0 refills | Status: DC

## 2018-09-08 NOTE — Patient Instructions (Addendum)
Thank you for choosing [facility] for your health care needs.  You have been diagnosed with a left ear infection and with a cough.  Take antibiotic, Augmentin, as prescribed. Take with food to prevent stomach upset. Take anti-fungal, Diflucan, if needed for vaginal yeast infection.  Increase fluids. Rest.  Stop decongestant: including Dayquil, Nyquil, Sudafed, Mucinex-D, etc.  May apply warm compress over left ear, for pain relief. May take over the counter Tylenol or ibuprofen for pain/discomfort.  Follow-up with Conrad Jasper, family physician, or urgent care in 2-3 days if symptoms not improving.  Your blood pressure is elevated at today's office visit. Recommend you follow-up with family physician for further evaluation and management.   Otitis Media, Adult  Otitis media means that the middle ear is red and swollen (inflamed) and full of fluid. The condition usually goes away on its own. Follow these instructions at home:  Take over-the-counter and prescription medicines only as told by your doctor.  If you were prescribed an antibiotic medicine, take it as told by your doctor. Do not stop taking the antibiotic even if you start to feel better.  Keep all follow-up visits as told by your doctor. This is important. Contact a doctor if:  You have bleeding from your nose.  There is a lump on your neck.  You are not getting better in 5 days.  You feel worse instead of better. Get help right away if:  You have pain that is not helped with medicine.  You have swelling, redness, or pain around your ear.  You get a stiff neck.  You cannot move part of your face (paralyzed).  You notice that the bone behind your ear hurts when you touch it.  You get a very bad headache. Summary  Otitis media means that the middle ear is red, swollen, and full of fluid.  This condition usually goes away on its own. In some cases, treatment may be needed.  If you were prescribed an  antibiotic medicine, take it as told by your doctor. This information is not intended to replace advice given to you by your health care provider. Make sure you discuss any questions you have with your health care provider. Document Released: 01/27/2008 Document Revised: 08/31/2016 Document Reviewed: 08/31/2016 Elsevier Interactive Patient Education  2019 [employer].

## 2018-09-08 NOTE — Progress Notes (Signed)
Patient ID: Amy Cook DOB: [birth year] AGE: 63 y.o. MRN: [id number]   PCP: Dorena Bodo, PA-C   Chief Complaint:  Chief Complaint  Patient presents with  . Sinusitis    x 5d  . Cough    x 5d     Subjective:    HPI:  Amy Cook is a 63 y.o. female presents for evaluation  Chief Complaint  Patient presents with  . Sinusitis    x 5d  . Cough    x 5d   Patient goes by "Amy Cook".  63 year old female presents to [facility] with 5 day history of URI symptoms. Began with post-nasal drip. Blew nose in to tissue; developed bilateral ear fullness, pressure, and muffled hearing. Left ear worse than right. Associated rhinorrhea, scratchy throat, and cough (cough x 2 days). Cough coarse sounding. Rarely productive. Has taken over the counter Nyquil with minimal relief. Denies fever, chills, headache, ear discharge/drainage, ear pain, tinnitus, sinus pain, chest pain, SOB, SOB with exertion, paroxysmal nocturnal dyspnea, wheezing, chest congestion/pressure, nausea/vomiting, peripheral edema.  Patient currently with no health insurance. Not regularly followed by PCP. Last seen by a PCP when she established care with Allayne Butcher PA-C with [facility] on 04/28/2017. Had been on 5mg  of amlodipine for HTN. BP was 160/88. Amlodipine was increased to 10mg  qd. Patient has not been seen in follow-up. No longer on blood pressure medication.  Patient denies smoking history. No asthma history. Has never been prescribed albuterol inhaler for bronchitis/wheezing.  Upon discussion with patient, reported only cardiac history of occasional PVCs. Cardiac murmur heard on today's examination; patient states new to her. Review in Epic (after patient left office) of H&P for hammer toe surgical procedure (2012), past medical history section reveals history of cardiac stent placement.  A limited review of symptoms was performed, pertinent positives and negatives as  mentioned in HPI.  The following portions of the patient's history were reviewed and updated as appropriate: allergies, current medications and past medical history.  Patient Active Problem List   Diagnosis Date Noted  . Hypertension 04/12/2017    No Known Allergies  No current outpatient medications on file prior to visit.   No current facility-administered medications on file prior to visit.        Objective:   Vitals:   09/08/18 1402  BP: (!) 180/120  Pulse: 80  Resp: 20  Temp: 98.1 F (36.7 C)  SpO2: 97%     Wt Readings from Last 3 Encounters:  09/08/18 164 lb (74.4 kg)  04/28/17 157 lb (71.2 kg)  04/12/17 153 lb 6.4 oz (69.6 kg)    Physical Exam:   General Appearance:  Patient sitting comfortably on examination table. Conversational. Peri Jefferson self-historian. In no acute distress. Afebrile.   Head:  Normocephalic, without obvious abnormality, atraumatic  Eyes:  PERRL, conjunctiva/corneas clear, EOM's intact  Ears:  Bilateral ear canals WNL. Right TM normal to inspection; pearly, shiny, no erythema or injection. No scar tissue. Left TM reveals diffuse erythema. Injection. Bulging. No evidence of perforation.  Nose: Nares normal. Deviated septum (nasal fracture 20 years ago). Nasal mucosa with minimal bilateral edema. Scant clear thin rhinorrhea. No sinus tenderness with percussion/palpation.  Throat: Lips, mucosa, and tongue normal; teeth and gums normal. Throat reveals no erythema. Tonsils with no enlargement or exudate.  Neck: Supple, symmetrical, trachea midline, no adenopathy  Lungs:   Clear to auscultation bilaterally, respirations unlabored. Good aeration. No wheezing. No rales, rhonchi, or crackles.  Coarse cough during examination; bronchitic.  Heart:  Regular rate and rhythm. 2/6 systolic cardiac murmur (unknown to patient)  Extremities: Extremities normal, atraumatic, no cyanosis or edema  Pulses: 2+ and symmetric  Skin: Skin color, texture, turgor normal, no  rashes or lesions  Lymph nodes: Cervical, supraclavicular, and axillary nodes normal  Neurologic: Normal    Assessment & Plan:    Exam findings, diagnosis etiology and medication use and indications reviewed with patient. Follow-Up and discharge instructions provided. No emergent/urgent issues found on exam.  Patient education was provided.   Patient verbalized understanding of information provided and agrees with plan of care (POC), all questions answered. The patient is advised to call or return to clinic if condition does not see an improvement in symptoms, or to seek the care of the closest emergency department if condition worsens with the below plan.    1. Acute left otitis media - amoxicillin-clavulanate (AUGMENTIN) 875-125 MG tablet; Take 1 tablet by mouth 2 (two) times daily for 7 days.  Dispense: 14 tablet; Refill: 0 - loratadine (CLARITIN) 10 MG tablet; Take 1 tablet (10 mg total) by mouth daily for 30 days.  Dispense: 30 tablet; Refill: 0 - fluticasone (FLONASE) 50 MCG/ACT nasal spray; Place 2 sprays into both nostrils daily.  Dispense: 16 g; Refill: 0 - fluconazole (DIFLUCAN) 150 MG tablet; Take 1 tablet po at development of vaginal yeast infections. Repeat in 3 days.  Dispense: 2 tablet; Refill: 0  2. Cough - benzonatate (TESSALON) 200 MG capsule; Take 1 capsule (200 mg total) by mouth 3 (three) times daily as needed for cough.  Dispense: 20 capsule; Refill: 0  3. Uncontrolled hypertension  4. Cardiac murmur, unspecified  Patient with 5 day history of URI symptoms. AOM on physical exam (erythematous/bulging left TM, presenting with muffled hearing/pressure). Afebrile. In no acute distress. Short duration. No recent antibiotic usage. Prescribed Augmentin (Diflucan in case of iatrogenic vaginal yeast infection). Prescribed antihistamine, Flonase, and Benzonatate. Advised [facility] or f/u with PCP or urgent care if not improving in 2-3 days.  Patient also  with known HTN. Currently not being treated. 180/120 today in office. Advised discontinuation of decongestants. Patient asymptomatic; denies headache, change in vision, chest pain, SOB, dizziness/lightheadedness. Advised patient follow-up with previous PCP or new PCP asap. Patient with soft systolic cardiac murmur heard today on exam; no mention in most previous PCP note. Advised PCP could re-listen, possibly order echocardiogram or refer to cardiologist.   Janalyn HarderSamantha Shaquila Sigman, MHS, PA-C Rulon SeraSamantha F. Sylvanna Burggraf, MHS, PA-C Advanced Practice Provider [facility]  [facility]  [address], [facility], [address]st Floor [place], [place] [address] (p):  [phone] [email or web address]

## 2018-09-09 ENCOUNTER — Ambulatory Visit: Payer: Self-pay | Admitting: Family Medicine

## 2018-09-09 ENCOUNTER — Encounter: Payer: Self-pay | Admitting: Family Medicine

## 2018-09-09 VITALS — BP 158/102 | HR 70 | Temp 97.7°F | Resp 16 | Ht 63.0 in | Wt 162.2 lb

## 2018-09-09 DIAGNOSIS — I10 Essential (primary) hypertension: Secondary | ICD-10-CM

## 2018-09-09 LAB — BASIC METABOLIC PANEL WITH GFR
BUN: 15 mg/dL (ref 7–25)
CO2: 31 mmol/L (ref 20–32)
Calcium: 10.1 mg/dL (ref 8.6–10.4)
Chloride: 98 mmol/L (ref 98–110)
Creat: 0.72 mg/dL (ref 0.50–0.99)
GFR, Est African American: 103 mL/min/{1.73_m2} (ref >=60)
GFR, Est Non African American: 89 mL/min/{1.73_m2} (ref >=60)
GLUCOSE: 72 mg/dL (ref 65–99)
Potassium: 4.3 mmol/L (ref 3.5–5.3)
Sodium: 140 mmol/L (ref 135–146)

## 2018-09-09 MED ORDER — AMLODIPINE BESYLATE 10 MG PO TABS: 10.0000 mg | ORAL_TABLET | Freq: Every day | ORAL | 3 refills | Status: DC

## 2018-09-09 NOTE — Patient Instructions (Signed)
Start medicines, try to continue to work on diet and lifestyle changes, monitor.  I hope that the blood pressure will continue to decrease as you recover from illness, but you will need to stay on BP meds because of your history, your family history, and how the diastolic has been >100 is very dangerous.      DASH Eating Plan DASH stands for "Dietary Approaches to Stop Hypertension." The DASH eating plan is a healthy eating plan that has been shown to reduce high blood pressure (hypertension). It may also reduce your risk for type 2 diabetes, heart disease, and stroke. The DASH eating plan may also help with weight loss. What are tips for following this plan?  General guidelines  Avoid eating more than 2,300 mg (milligrams) of salt (sodium) a day. If you have hypertension, you may need to reduce your sodium intake to 1,500 mg a day.  Limit alcohol intake to no more than 1 drink a day for nonpregnant women and 2 drinks a day for men. One drink equals 12 oz of beer, 5 oz of wine, or 1 oz of hard liquor.  Work with your health care provider to maintain a healthy body weight or to lose weight. Ask what an ideal weight is for you.  Get at least 30 minutes of exercise that causes your heart to beat faster (aerobic exercise) most days of the week. Activities may include walking, swimming, or biking.  Work with your health care provider or diet and nutrition specialist (dietitian) to adjust your eating plan to your individual calorie needs. Reading food labels   Check food labels for the amount of sodium per serving. Choose foods with less than 5 percent of the Daily Value of sodium. Generally, foods with less than 300 mg of sodium per serving fit into this eating plan.  To find whole grains, look for the word "whole" as the first word in the ingredient list. Shopping  Buy products labeled as "low-sodium" or "no salt added."  Buy fresh foods. Avoid canned foods and premade or frozen  meals. Cooking  Avoid adding salt when cooking. Use salt-free seasonings or herbs instead of table salt or sea salt. Check with your health care provider or pharmacist before using salt substitutes.  Do not fry foods. Cook foods using healthy methods such as baking, boiling, grilling, and broiling instead.  Cook with heart-healthy oils, such as olive, canola, soybean, or sunflower oil. Meal planning  Eat a balanced diet that includes: ? 5 or more servings of fruits and vegetables each day. At each meal, try to fill half of your plate with fruits and vegetables. ? Up to 6-8 servings of whole grains each day. ? Less than 6 oz of lean meat, poultry, or fish each day. A 3-oz serving of meat is about the same size as a deck of cards. One egg equals 1 oz. ? 2 servings of low-fat dairy each day. ? A serving of nuts, seeds, or beans 5 times each week. ? Heart-healthy fats. Healthy fats called Omega-3 fatty acids are found in foods such as flaxseeds and coldwater fish, like sardines, salmon, and mackerel.  Limit how much you eat of the following: ? Canned or prepackaged foods. ? Food that is high in trans fat, such as fried foods. ? Food that is high in saturated fat, such as fatty meat. ? Sweets, desserts, sugary drinks, and other foods with added sugar. ? Full-fat dairy products.  Do not salt foods before eating.  Try to eat at least 2 vegetarian meals each week.  Eat more home-cooked food and less restaurant, buffet, and fast food.  When eating at a restaurant, ask that your food be prepared with less salt or no salt, if possible. What foods are recommended? The items listed may not be a complete list. Talk with your dietitian about what dietary choices are best for you. Grains Whole-grain or whole-wheat bread. Whole-grain or whole-wheat pasta. Brown rice. [place] Morrow. Bulgur. Whole-grain and low-sodium cereals. Pita bread. Low-fat, low-sodium crackers. Whole-wheat flour  tortillas. Vegetables Fresh or frozen vegetables (raw, steamed, roasted, or grilled). Low-sodium or reduced-sodium tomato and vegetable juice. Low-sodium or reduced-sodium tomato sauce and tomato paste. Low-sodium or reduced-sodium canned vegetables. Fruits All fresh, dried, or frozen fruit. Canned fruit in natural juice (without added sugar). Meat and other protein foods Skinless chicken or [place]. Ground chicken or [place]. Pork with fat trimmed off. Fish and seafood. Egg whites. Dried beans, peas, or lentils. Unsalted nuts, nut butters, and seeds. Unsalted canned beans. Lean cuts of beef with fat trimmed off. Low-sodium, lean deli meat. Dairy Low-fat (1%) or fat-free (skim) milk. Fat-free, low-fat, or reduced-fat cheeses. Nonfat, low-sodium ricotta or cottage cheese. Low-fat or nonfat yogurt. Low-fat, low-sodium cheese. Fats and oils Soft margarine without trans fats. Vegetable oil. Low-fat, reduced-fat, or light mayonnaise and salad dressings (reduced-sodium). Canola, safflower, olive, soybean, and sunflower oils. Avocado. Seasoning and other foods Herbs. Spices. Seasoning mixes without salt. Unsalted popcorn and pretzels. Fat-free sweets. What foods are not recommended? The items listed may not be a complete list. Talk with your dietitian about what dietary choices are best for you. Grains Baked goods made with fat, such as croissants, muffins, or some breads. Dry pasta or rice meal packs. Vegetables Creamed or fried vegetables. Vegetables in a cheese sauce. Regular canned vegetables (not low-sodium or reduced-sodium). Regular canned tomato sauce and paste (not low-sodium or reduced-sodium). Regular tomato and vegetable juice (not low-sodium or reduced-sodium). Angie Fava. Olives. Fruits Canned fruit in a light or heavy syrup. Fried fruit. Fruit in cream or butter sauce. Meat and other protein foods Fatty cuts of meat. Ribs. Fried meat. Berniece Salines. Sausage. Bologna and other processed lunch meats.  Salami. Fatback. Hotdogs. Bratwurst. Salted nuts and seeds. Canned beans with added salt. Canned or smoked fish. Whole eggs or egg yolks. Chicken or [place] with skin. Dairy Whole or 2% milk, cream, and half-and-half. Whole or full-fat cream cheese. Whole-fat or sweetened yogurt. Full-fat cheese. Nondairy creamers. Whipped toppings. Processed cheese and cheese spreads. Fats and oils Butter. Stick margarine. Lard. Shortening. Ghee. Bacon fat. Tropical oils, such as coconut, palm kernel, or palm oil. Seasoning and other foods Salted popcorn and pretzels. Onion salt, garlic salt, seasoned salt, table salt, and sea salt. Worcestershire sauce. Tartar sauce. Barbecue sauce. Teriyaki sauce. Soy sauce, including reduced-sodium. Steak sauce. Canned and packaged gravies. Fish sauce. Oyster sauce. Cocktail sauce. Horseradish that you find on the shelf. Ketchup. Mustard. Meat flavorings and tenderizers. Bouillon cubes. Hot sauce and Tabasco sauce. Premade or packaged marinades. Premade or packaged taco seasonings. Relishes. Regular salad dressings. Where to find more information:  National Heart, Lung, and [place]: https://wilson-eaton.com/  American Heart Association: www.heart.org Summary  The DASH eating plan is a healthy eating plan that has been shown to reduce high blood pressure (hypertension). It may also reduce your risk for type 2 diabetes, heart disease, and stroke.  With the DASH eating plan, you should limit salt (sodium) intake to 2,300 mg a day. If  you have hypertension, you may need to reduce your sodium intake to 1,500 mg a day.  When on the DASH eating plan, aim to eat more fresh fruits and vegetables, whole grains, lean proteins, low-fat dairy, and heart-healthy fats.  Work with your health care provider or diet and nutrition specialist (dietitian) to adjust your eating plan to your individual calorie needs. This information is not intended to replace advice given to you by your health  care provider. Make sure you discuss any questions you have with your health care provider. Document Released: 07/30/2011 Document Revised: 08/03/2016 Document Reviewed: 08/03/2016 Elsevier Interactive Patient Education  2019 [employer]   Managing Your Hypertension Hypertension is commonly called high blood pressure. This is when the force of your blood pressing against the walls of your arteries is too strong. Arteries are blood vessels that carry blood from your heart throughout your body. Hypertension forces the heart to work harder to pump blood, and may cause the arteries to become narrow or stiff. Having untreated or uncontrolled hypertension can cause heart attack, stroke, kidney disease, and other problems. What are blood pressure readings? A blood pressure reading consists of a higher number over a lower number. Ideally, your blood pressure should be below 120/80. The first ("top") number is called the systolic pressure. It is a measure of the pressure in your arteries as your heart beats. The second ("bottom") number is called the diastolic pressure. It is a measure of the pressure in your arteries as the heart relaxes. What does my blood pressure reading mean? Blood pressure is classified into four stages. Based on your blood pressure reading, your health care provider may use the following stages to determine what type of treatment you need, if any. Systolic pressure and diastolic pressure are measured in a unit called mm Hg. Normal  Systolic pressure: below 120.  Diastolic pressure: below 80. Elevated  Systolic pressure: 120-129.  Diastolic pressure: below 80. Hypertension stage 1  Systolic pressure: 130-139.  Diastolic pressure: 80-89. Hypertension stage 2  Systolic pressure: 140 or above.  Diastolic pressure: 90 or above. What health risks are associated with hypertension? Managing your hypertension is an important responsibility. Uncontrolled hypertension can lead  to:  A heart attack.  A stroke.  A weakened blood vessel (aneurysm).  Heart failure.  Kidney damage.  Eye damage.  Metabolic syndrome.  Memory and concentration problems. What changes can I make to manage my hypertension? Hypertension can be managed by making lifestyle changes and possibly by taking medicines. Your health care provider will help you make a plan to bring your blood pressure within a normal range. Eating and drinking   Eat a diet that is high in fiber and potassium, and low in salt (sodium), added sugar, and fat. An example eating plan is called the DASH (Dietary Approaches to Stop Hypertension) diet. To eat this way: ? Eat plenty of fresh fruits and vegetables. Try to fill half of your plate at each meal with fruits and vegetables. ? Eat whole grains, such as whole wheat pasta, brown rice, or whole grain bread. Fill about one quarter of your plate with whole grains. ? Eat low-fat diary products. ? Avoid fatty cuts of meat, processed or cured meats, and poultry with skin. Fill about one quarter of your plate with lean proteins such as fish, chicken without skin, beans, eggs, and tofu. ? Avoid premade and processed foods. These tend to be higher in sodium, added sugar, and fat.  Reduce your daily sodium  intake. Most people with hypertension should eat less than 1,500 mg of sodium a day.  Limit alcohol intake to no more than 1 drink a day for nonpregnant women and 2 drinks a day for men. One drink equals 12 oz of beer, 5 oz of wine, or 1 oz of hard liquor. Lifestyle  Work with your health care provider to maintain a healthy body weight, or to lose weight. Ask what an ideal weight is for you.  Get at least 30 minutes of exercise that causes your heart to beat faster (aerobic exercise) most days of the week. Activities may include walking, swimming, or biking.  Include exercise to strengthen your muscles (resistance exercise), such as weight lifting, as part of your  weekly exercise routine. Try to do these types of exercises for 30 minutes at least 3 days a week.  Do not use any products that contain nicotine or tobacco, such as cigarettes and e-cigarettes. If you need help quitting, ask your health care provider.  Control any long-term (chronic) conditions you have, such as high cholesterol or diabetes. Monitoring  Monitor your blood pressure at home as told by your health care provider. Your personal target blood pressure may vary depending on your medical conditions, your age, and other factors.  Have your blood pressure checked regularly, as often as told by your health care provider. Working with your health care provider  Review all the medicines you take with your health care provider because there may be side effects or interactions.  Talk with your health care provider about your diet, exercise habits, and other lifestyle factors that may be contributing to hypertension.  Visit your health care provider regularly. Your health care provider can help you create and adjust your plan for managing hypertension. Will I need medicine to control my blood pressure? Your health care provider may prescribe medicine if lifestyle changes are not enough to get your blood pressure under control, and if:  Your systolic blood pressure is 130 or higher.  Your diastolic blood pressure is 80 or higher. Take medicines only as told by your health care provider. Follow the directions carefully. Blood pressure medicines must be taken as prescribed. The medicine does not work as well when you skip doses. Skipping doses also puts you at risk for problems. Contact a health care provider if:  You think you are having a reaction to medicines you have taken.  You have repeated (recurrent) headaches.  You feel dizzy.  You have swelling in your ankles.  You have trouble with your vision. Get help right away if:  You develop a severe headache or confusion.  You  have unusual weakness or numbness, or you feel faint.  You have severe pain in your chest or abdomen.  You vomit repeatedly.  You have trouble breathing. Summary  Hypertension is when the force of blood pumping through your arteries is too strong. If this condition is not controlled, it may put you at risk for serious complications.  Your personal target blood pressure may vary depending on your medical conditions, your age, and other factors. For most people, a normal blood pressure is less than 120/80.  Hypertension is managed by lifestyle changes, medicines, or both. Lifestyle changes include weight loss, eating a healthy, low-sodium diet, exercising more, and limiting alcohol. This information is not intended to replace advice given to you by your health care provider. Make sure you discuss any questions you have with your health care provider. Document Released: 05/04/2012 Document  severe headache or confusion.   You have unusual weakness or numbness.   You feel faint.   You have severe pain in your chest or abdomen.   You vomit repeatedly.   You have trouble breathing.  Summary   Hypertension is when the force of blood pumping through your arteries is too strong. If this condition is not controlled, it may put you at risk for  serious complications.   Your personal target blood pressure may vary depending on your medical conditions, your age, and other factors. For most people, a normal blood pressure is less than 120/80.   Hypertension is treated with lifestyle changes, medicines, or a combination of both. Lifestyle changes include weight loss, eating a healthy, low-sodium diet, exercising more, and limiting alcohol.  This information is not intended to replace advice given to you by your health care provider. Make sure you discuss any questions you have with your health care provider.  Document Released: 08/10/2005 Document Revised: 07/08/2016 Document Reviewed: 07/08/2016  Elsevier Interactive Patient Education  2019 Elsevier Inc.

## 2018-09-09 NOTE — Progress Notes (Signed)
Patient ID: Amy Cook, female    DOB: 12-01-1955, 63 y.o.   MRN: [id number]  PCP: Dorena Bodoixon, Mary B, PA-C  Chief Complaint  Patient presents with  . Hypertension    Patient in for a follow up on hypertension. She also has ear infection to left ear.     Subjective:   Amy Cook is a 63 y.o. female, presents to clinic with CC of uncontrolled BP, she has not been to clinic in over a year, past dx of HTN, she is not currently on meds and does not know what she was taking.  She comes in today because yesterday she was seen at an UC and her BP was 180/120, she was ill with URI and ear sx and she was diagnosed and tx for ear infection, encouraged to f/up for BP.  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  She states that for the past several weeks a diastolic blood pressure has been high between 115 and 120.  Her illness started in the last week, and her only symptoms in the last couple weeks has been she felt that her head was in a ball her ears were plugged.  To me she denies any history of heart disease for herself but states she been try to manage her hypertension with diet exercise supplements vitamins etc. she notes that she has 8 siblings and many of them have hypertension and hyperlipidemia her mom and dad both had heart disease none of a blood pressure.  She denies any history of smoking asthma lung disease or alcohol use.  She currently denies any chest pain, chest pressure, shortness of breath, palpitations, orthopnea, lower extremity edema, PND, visual disturbances headaches, near syncope, vertigo, numbness tingling weakness, slurred speech, facial droop.  She has normal urine output denies swelling anywhere.  I reviewed the [facility] visit from yesterday-  note states that she pt was on decongestants when her blood  pressure was 180/120.  She was instructed to d/c.  The PA noted a soft systolic murmur on exam, and also documented that she was able to find in chart past medical history of cardiac stent and occasional PVCs.  I cannot find the heart hx.   Hypertension, follow-up:  BP Readings from Last 3 Encounters:  09/09/18 (!) 158/102  09/08/18 (!) 180/120  04/28/17 (!) 160/88   Patient is not on a low-salt diet she states that she does whole 30 tries to avoid sugar milk but she loves bread he is not exercising, does have chronic back and joint pain she is taking a lot of ibuprofen almost every day  Per chart review last labs are noted below significant for hyperlipidemia Last Lipid Panel:    Component Value Date/Time   CHOL 235 (H) 04/12/2017 0921   TRIG 100 04/12/2017 0921   HDL 73 04/12/2017 0921   CHOLHDL 3.2 04/12/2017 0921   VLDL 20 04/12/2017 0921   LDLCALC 142 (H) 04/12/2017 0921     Wt Readings from Last 3 Encounters:  09/09/18 162 lb 4 oz (73.6 kg)  09/08/18 164 lb (74.4 kg)  04/28/17 157 lb (71.2 kg)     Patient Active Problem List   Diagnosis Date Noted  . Hypertension 04/12/2017     Prior to Admission medications   Medication Sig Start Date End Date  Taking? Authorizing Provider  amoxicillin-clavulanate (AUGMENTIN) 875-125 MG tablet Take 1 tablet by mouth 2 (two) times daily for 7 days. 09/08/18 09/15/18 Yes Janalyn HarderSinger, Samantha, PA-C  benzonatate (TESSALON) 200 MG capsule Take 1 capsule (200 mg total) by mouth 3 (three) times daily as needed for cough. 09/08/18  Yes Janalyn HarderSinger, Samantha, PA-C  fluticasone (FLONASE) 50 MCG/ACT nasal spray Place 2 sprays into both nostrils daily. 09/08/18  Yes Janalyn HarderSinger, Samantha, PA-C  loratadine (CLARITIN) 10 MG tablet Take 1 tablet (10 mg total) by mouth daily for 30 days. 09/08/18 10/08/18 Yes Janalyn HarderSinger, Samantha, PA-C  fluconazole (DIFLUCAN) 150 MG tablet Take 1 tablet po at development of vaginal yeast infections. Repeat in 3 days. Patient not taking:  Reported on 09/09/2018 09/08/18   Janalyn HarderSinger, Samantha, PA-C     No Known Allergies   Family History  Problem Relation Age of Onset  . Depression Mother   . Hypertension Mother   . Hyperlipidemia Mother   . Stroke Mother   . Alcohol abuse Father   . Heart disease Father   . Hypertension Father   . Hyperlipidemia Father   . COPD Sister   . Depression Sister   . Alcohol abuse Brother   . Depression Brother   . Depression Daughter   . Alcohol abuse Maternal Uncle   . Alcohol abuse Paternal Uncle   . Alcohol abuse Brother   . Depression Brother      Social History   Socioeconomic History  . Marital status: Married    Spouse name: Not on file  . Number of children: Not on file  . Years of education: Not on file  . Highest education level: Not on file  Occupational History  . Not on file  Social Needs  . Financial resource strain: Not on file  . Food insecurity:    Worry: Not on file    Inability: Not on file  . Transportation needs:    Medical: Not on file    Non-medical: Not on file  Tobacco Use  . Smoking status: Never Smoker  . Smokeless tobacco: Never Used  Substance and Sexual Activity  . Alcohol use: No  . Drug use: No  . Sexual activity: Yes  Lifestyle  . Physical activity:    Days per week: Not on file    Minutes per session: Not on file  . Stress: Not on file  Relationships  . Social connections:    Talks on phone: Not on file    Gets together: Not on file    Attends religious service: Not on file    Active member of club or organization: Not on file    Attends meetings of clubs or organizations: Not on file    Relationship status: Not on file  . Intimate partner violence:    Fear of current or ex partner: Not on file    Emotionally abused: Not on file    Physically abused: Not on file    Forced sexual activity: Not on file  Other Topics Concern  . Not on file  Social History Narrative  . Not on file     Review of Systems  Constitutional:  Negative.  Negative for activity change, appetite change, chills, diaphoresis, fatigue, fever and unexpected weight change.  HENT: Negative.  Negative for congestion.   Eyes: Negative.   Respiratory: Negative.  Negative for apnea, cough, chest tightness, shortness of breath and stridor.   Cardiovascular: Negative.  Negative for chest pain, palpitations and leg swelling.  Gastrointestinal: Negative.   Endocrine: Negative.   Genitourinary: Negative.   Musculoskeletal: Negative.   Skin: Negative.   Allergic/Immunologic: Negative.   Neurological: Negative.  Negative for dizziness, tremors, syncope, light-headedness, numbness and headaches.  Hematological: Negative.   Psychiatric/Behavioral: Negative.   All other systems reviewed and are negative.      Objective:    Vitals:   09/09/18 1112  BP: (!) 158/102  Pulse: 70  Resp: 16  Temp: 97.7 F (36.5 C)  TempSrc: Oral  SpO2: 98%  Weight: 162 lb 4 oz (73.6 kg)  Height: 5\' 3"  (1.6 m)      Physical Exam Vitals signs and nursing note reviewed.  Constitutional:      General: She is not in acute distress.    Appearance: Normal appearance. She is well-developed. She is not ill-appearing, toxic-appearing or diaphoretic.  HENT:     Head: Normocephalic and atraumatic.     Right Ear: External ear normal.     Left Ear: External ear normal.     Nose: Nose normal. No congestion.     Mouth/Throat:     Mouth: Mucous membranes are moist.     Pharynx: Uvula midline. No posterior oropharyngeal erythema.  Eyes:     General: Lids are normal.     Conjunctiva/sclera: Conjunctivae normal.     Pupils: Pupils are equal, round, and reactive to light.  Neck:     Musculoskeletal: Normal range of motion and neck supple. No neck rigidity.     Trachea: Phonation normal. No tracheal deviation.  Cardiovascular:     Rate and Rhythm: Normal rate and regular rhythm.  No extrasystoles are present.    Pulses: Normal pulses.          Radial pulses are 2+  on the right side and 2+ on the left side.       Dorsalis pedis pulses are 2+ on the right side and 2+ on the left side.       Posterior tibial pulses are 2+ on the right side and 2+ on the left side.     Heart sounds: Normal heart sounds. No murmur. No friction rub. No gallop.   Pulmonary:     Effort: Pulmonary effort is normal. No respiratory distress.     Breath sounds: Normal breath sounds. No stridor. No wheezing, rhonchi or rales.  Chest:     Chest wall: No tenderness.  Abdominal:     General: Bowel sounds are normal. There is no distension.     Palpations: Abdomen is soft.     Tenderness: There is no abdominal tenderness. There is no guarding or rebound.  Musculoskeletal: Normal range of motion.        General: No deformity.     Right lower leg: No edema.     Left lower leg: No edema.  Lymphadenopathy:     Cervical: No cervical adenopathy.  Skin:    General: Skin is warm and dry.     Capillary Refill: Capillary refill takes less than 2 seconds.     Coloration: Skin is not pale.     Findings: No rash.  Neurological:     Mental Status: She is alert and oriented to person, place, and time.     Cranial Nerves: No cranial nerve deficit.     Sensory: No sensory deficit.     Motor: No abnormal muscle tone.     Coordination: Coordination normal.     Gait: Gait normal.  Psychiatric:  Mood and Affect: Mood normal.        Speech: Speech normal.        Behavior: Behavior normal.           Assessment & Plan:      ICD-10-CM   1. Essential hypertension I10 BASIC METABOLIC PANEL WITH GFR    Pt lost to follow up due to no insurance - present with uncontrolled HTN, off meds, asx, do not suspect any end organ damage.  BP improved today vs yesterday at urgent care.  She was instructed to stop NSAIDs and decongestants.  Previously she was on 10 mg of amlodipine but that did not ever seem to get control will restart this today I printed the prescription and looked at cash price  options for her so it is affordable.  We will start this medication to lower the blood pressure while waiting for renal function.  We have discussed the consequences of uncontrolled hypertension habits are high risk for heart attack, strokes, developing heart failure.  She is very naturopathic wants to use supplements and interesting dietary stuff and exercises to help her blood pressure however she is has a strong family history I explained to her that I do not believe it would be adequate to get her controlled to lower her risk.  Did print offer her the DASH diet and other ways to manage her hypertension.  She will need to follow-up in the next week.  If she cannot afford to follow-up here with the office visits and labs she was encouraged to look in her [place] for any [facility] that do sliding scale so that she can get the care that she needs.  She was instructed to return the BP log in 1 to 2 weeks regardless of ability to pay for follow-up appointment.  If her blood pressures continue to be elevated despite using amlodipine and if her renal function is normal would want to start her on another BP med like lisinopril.   I did review the [facility] visit in its entirety after the patient had left a noted murmur, I did not hear murmur, will recheck again when doing cardiac exam when she follows up   Danelle Berry, PA-C 09/09/18 11:57 AM

## 2018-09-13 ENCOUNTER — Telehealth: Payer: Self-pay | Admitting: Emergency Medicine

## 2018-09-13 NOTE — Progress Notes (Signed)
Her kidney function was good, have her check in with more readings.  I would add losartan 25 mg po daily if she continued to be >140/90.   If her BP is at goal with just norvasc then she can follow up in a few months.  If her BP is too high, we will start losartan and recheck her 2 weeks after adding losartan.    So either way we can probably push back her appt since her insurance and copay are so high.

## 2018-09-13 NOTE — Telephone Encounter (Signed)
Spoke with patient doing better not feeling in a cloud any more. This was a follow up call from visit with [facility].

## 2018-09-14 ENCOUNTER — Other Ambulatory Visit: Payer: Self-pay

## 2018-09-23 ENCOUNTER — Ambulatory Visit: Payer: Self-pay | Admitting: Family Medicine

## 2018-09-28 ENCOUNTER — Ambulatory Visit (INDEPENDENT_AMBULATORY_CARE_PROVIDER_SITE_OTHER): Payer: Self-pay | Admitting: Family Medicine

## 2018-09-28 ENCOUNTER — Encounter: Payer: Self-pay | Admitting: Family Medicine

## 2018-09-28 VITALS — BP 132/98 | HR 56 | Temp 97.6°F | Resp 15 | Ht 63.0 in | Wt 163.4 lb

## 2018-09-28 DIAGNOSIS — I10 Essential (primary) hypertension: Secondary | ICD-10-CM

## 2018-09-28 MED ORDER — AMLODIPINE BESYLATE 10 MG PO TABS: 10.0000 mg | ORAL_TABLET | Freq: Every day | ORAL | 3 refills | Status: DC

## 2018-09-28 NOTE — Progress Notes (Signed)
Patient ID: Amy Cook Switala, female    DOB: [birth year], 63 y.o.   MRN: [id number]  PCP: Danelle Berryapia, Aerik Polan, PA-C  Chief Complaint  Patient presents with  . Hypertension    Patient in today for a 2 week follow up on hypertension. Has been on Losartan    Subjective:   Amy Cook Pigford is a 63 y.o. female, presents to clinic with CC of 2 week follow up for HTN, she has taken norvasc 10 mg daily x 2 weeks.  She did not take it this am.  She has been monitoring her BP at home, reports readings ranging systolic 138-140 and diastolic 70-80.  It is higher this am.  She was having some headaches prior to last visit when off BP meds, and HA's have completely resolved.  She's had no SE of meds.  She denies CP, sob, orthopnea, LE edema, palpitations, visual disturbances.  She is going to transition care to a health center with sliding scale - has appointment next month. She did get 30 d supply of med due to cost at [facility] - and she got one refill. Incidentally in the past week her niece died from suspected stroke or MI, she was 64 years old.  No other known family hx of early MI stroke or death.          Patient Active Problem List   Diagnosis Date Noted  . Hypertension 04/12/2017     Prior to Admission medications   Medication Sig Start Date End Date Taking? Authorizing Provider  amLODipine (NORVASC) 10 MG tablet Take 1 tablet (10 mg total) by mouth daily. 09/09/18  Yes Danelle Berryapia, Noe Pittsley, PA-C  benzonatate (TESSALON) 200 MG capsule Take 1 capsule (200 mg total) by mouth 3 (three) times daily as needed for cough. Patient not taking: Reported on 09/28/2018 09/08/18   Janalyn HarderSinger, Samantha, PA-C  fluconazole (DIFLUCAN) 150 MG tablet Take 1 tablet po at development of vaginal yeast infections. Repeat in 3 days. Patient not taking: Reported on 09/09/2018 09/08/18   Janalyn HarderSinger, Samantha, PA-C  fluticasone [facility](FLONASE) 50 MCG/ACT nasal spray Place 2 sprays into both nostrils daily. Patient not taking: Reported on  09/28/2018 09/08/18   Janalyn HarderSinger, Samantha, PA-C  loratadine (CLARITIN) 10 MG tablet Take 1 tablet (10 mg total) by mouth daily for 30 days. Patient not taking: Reported on 09/28/2018 09/08/18 10/08/18  Janalyn HarderSinger, Samantha, PA-C     No Known Allergies   Family History  Problem Relation Age of Onset  . Depression Mother   . Hypertension Mother   . Hyperlipidemia Mother   . Stroke Mother   . Alcohol abuse Father   . Heart disease Father   . Hypertension Father   . Hyperlipidemia Father   . COPD Sister   . Depression Sister   . Alcohol abuse Brother   . Depression Brother   . Depression Daughter   . Alcohol abuse Maternal Uncle   . Alcohol abuse Paternal Uncle   . Alcohol abuse Brother   . Depression Brother      Social History   Socioeconomic History  . Marital status: Married    Spouse name: Not on file  . Number of children: Not on file  . Years of education: Not on file  . Highest education level: Not on file  Occupational History  . Not on file  Social Needs  . Financial resource strain: Not on file  . Food insecurity:    Worry: Not on file    Inability:  Not on file  . Transportation needs:    Medical: Not on file    Non-medical: Not on file  Tobacco Use  . Smoking status: Never Smoker  . Smokeless tobacco: Never Used  Substance and Sexual Activity  . Alcohol use: No  . Drug use: No  . Sexual activity: Yes  Lifestyle  . Physical activity:    Days per week: Not on file    Minutes per session: Not on file  . Stress: Not on file  Relationships  . Social connections:    Talks on phone: Not on file    Gets together: Not on file    Attends religious service: Not on file    Active member of club or organization: Not on file    Attends meetings of clubs or organizations: Not on file    Relationship status: Not on file  . Intimate partner violence:    Fear of current or ex partner: Not on file    Emotionally abused: Not on file    Physically abused: Not on file     Forced sexual activity: Not on file  Other Topics Concern  . Not on file  Social History Narrative  . Not on file     Review of Systems  Constitutional: Negative.   HENT: Negative.   Eyes: Negative.   Respiratory: Negative.   Cardiovascular: Negative.   Gastrointestinal: Negative.   Endocrine: Negative.   Genitourinary: Negative.   Musculoskeletal: Negative.   Skin: Negative.   Allergic/Immunologic: Negative.   Neurological: Negative.   Hematological: Negative.   Psychiatric/Behavioral: Negative.   All other systems reviewed and are negative.      Objective:    Vitals:   09/28/18 0931  BP: (!) 132/98  Pulse: (!) 56  Resp: 15  Temp: 97.6 F (36.4 C)  TempSrc: Oral  SpO2: 98%  Weight: 163 lb 6 oz (74.1 kg)  Height: 5\' 3"  (1.6 m)      Physical Exam Vitals signs and nursing note reviewed.  Constitutional:      General: She is not in acute distress.    Appearance: Normal appearance. She is well-developed. She is not toxic-appearing or diaphoretic.  HENT:     Head: Normocephalic and atraumatic.     Right Ear: External ear normal.     Left Ear: External ear normal.     Nose: Nose normal.     Mouth/Throat:     Pharynx: Uvula midline.  Eyes:     General: Lids are normal.     Conjunctiva/sclera: Conjunctivae normal.     Pupils: Pupils are equal, round, and reactive to light.  Neck:     Musculoskeletal: Normal range of motion and neck supple.     Trachea: Phonation normal. No tracheal deviation.  Cardiovascular:     Rate and Rhythm: Normal rate and regular rhythm.     Pulses: Normal pulses.          Radial pulses are 2+ on the right side and 2+ on the left side.       Posterior tibial pulses are 2+ on the right side and 2+ on the left side.     Heart sounds: Normal heart sounds. No murmur. No friction rub. No gallop.   Pulmonary:     Effort: Pulmonary effort is normal. No respiratory distress.     Breath sounds: Normal breath sounds. No stridor. No  wheezing, rhonchi or rales.  Chest:     Chest wall: No tenderness.  Abdominal:     General: Bowel sounds are normal. There is no distension.     Palpations: Abdomen is soft.     Tenderness: There is no abdominal tenderness. There is no guarding or rebound.  Musculoskeletal: Normal range of motion.        General: No deformity.  Lymphadenopathy:     Cervical: No cervical adenopathy.  Skin:    General: Skin is warm and dry.     Capillary Refill: Capillary refill takes less than 2 seconds.     Coloration: Skin is not pale.     Findings: No rash.  Neurological:     Mental Status: She is alert and oriented to person, place, and time.     Motor: No abnormal muscle tone.     Gait: Gait normal.  Psychiatric:        Speech: Speech normal.        Behavior: Behavior normal.           Assessment & Plan:    Problem List Items Addressed This Visit      Cardiovascular and Mediastinum   Hypertension - Primary    Improved BP - a little high today, pt did not take meds this am though, HA resolved, no SE on meds. Continue norvasc 10 mg and continue to monitor BP, continue lifestyle efforts - DASH, exercise F/up in 3 months Printed refill given to pt for 90 d supply - she is going to transition to another clinic in the next month      Relevant Medications   amLODipine (NORVASC) 10 MG tablet (Start on 10/24/2018)          Danelle BerryLeisa Korver Graybeal, PA-C 09/28/18 9:53 AM

## 2018-09-28 NOTE — Assessment & Plan Note (Signed)
Improved BP - a little high today, pt did not take meds this am though, HA resolved, no SE on meds. Continue norvasc 10 mg and continue to monitor BP, continue lifestyle efforts - DASH, exercise F/up in 3 months Printed refill given to pt for 90 d supply - she is going to transition to another clinic in the next month

## 2021-04-01 ENCOUNTER — Encounter (INDEPENDENT_AMBULATORY_CARE_PROVIDER_SITE_OTHER): Payer: Self-pay | Admitting: *Deleted

## 2021-04-15 ENCOUNTER — Encounter (INDEPENDENT_AMBULATORY_CARE_PROVIDER_SITE_OTHER): Payer: Self-pay | Admitting: *Deleted

## 2021-08-08 ENCOUNTER — Telehealth (INDEPENDENT_AMBULATORY_CARE_PROVIDER_SITE_OTHER): Payer: Self-pay

## 2021-08-08 ENCOUNTER — Encounter (INDEPENDENT_AMBULATORY_CARE_PROVIDER_SITE_OTHER): Payer: Self-pay

## 2021-08-08 ENCOUNTER — Other Ambulatory Visit (INDEPENDENT_AMBULATORY_CARE_PROVIDER_SITE_OTHER): Payer: Self-pay

## 2021-08-08 DIAGNOSIS — Z1211 Encounter for screening for malignant neoplasm of colon: Secondary | ICD-10-CM

## 2021-08-08 MED ORDER — PEG 3350-KCL-NA BICARB-NACL 420 G PO SOLR: 4000.0000 mL | ORAL | 0 refills | Status: DC

## 2021-08-08 NOTE — Telephone Encounter (Signed)
Amy Cook, CMA  

## 2021-08-08 NOTE — Telephone Encounter (Signed)
Referring MD/PCP: Durene Cal  Procedure: Tcs  Reason/Indication:  Screening  Has patient had this procedure before?  no  If so, when, by whom and where?    Is there a family history of colon cancer?  no  Who?  What age when diagnosed?    Is patient diabetic? If yes, Type 1 or Type 2   no      Does patient have prosthetic heart valve or mechanical valve?  no  Do you have a pacemaker/defibrillator?  no  Has patient ever had endocarditis/atrial fibrillation? no  Does patient use oxygen? no  Has patient had joint replacement within last 12 months?  no  Is patient constipated or do they take laxatives? no  Does patient have a history of alcohol/drug use?  no  Have you had a stroke/heart attack last 6 mths? no  Do you take medicine for weight loss?  no  For female patients,: have you had a hysterectomy or are you post menopausal and do you still have your menstrual cycle? no  Is patient on blood thinner such as Coumadin, Plavix and/or Aspirin? no  Medications: Tylenol 325mg  daily, Ibuprofen 600 mg prn, amlodipine 10 mg daily  Allergies: nkda  Medication Adjustment per Dr none  Procedure date & time: Thursday 08/21/21 11:15

## 2021-08-11 ENCOUNTER — Encounter (INDEPENDENT_AMBULATORY_CARE_PROVIDER_SITE_OTHER): Payer: Self-pay | Admitting: *Deleted

## 2021-08-21 ENCOUNTER — Encounter (HOSPITAL_COMMUNITY)
Admission: RE | Disposition: A | Discharge status: Home / Self Care | Payer: Self-pay | Source: Home / Self Care | Attending: Internal Medicine

## 2021-08-21 ENCOUNTER — Encounter (HOSPITAL_COMMUNITY): Payer: Self-pay | Admitting: Internal Medicine

## 2021-08-21 ENCOUNTER — Other Ambulatory Visit: Payer: Self-pay

## 2021-08-21 ENCOUNTER — Ambulatory Visit (HOSPITAL_COMMUNITY)
Admission: RE | Admit: 2021-08-21 | Discharge: 2021-08-21 | Disposition: A | Discharge status: Home / Self Care | Payer: Medicare Other | Attending: Internal Medicine | Admitting: Internal Medicine

## 2021-08-21 DIAGNOSIS — K644 Residual hemorrhoidal skin tags: Secondary | ICD-10-CM | POA: Insufficient documentation

## 2021-08-21 DIAGNOSIS — K5730 Diverticulosis of large intestine without perforation or abscess without bleeding: Secondary | ICD-10-CM | POA: Insufficient documentation

## 2021-08-21 DIAGNOSIS — K648 Other hemorrhoids: Secondary | ICD-10-CM | POA: Diagnosis not present

## 2021-08-21 DIAGNOSIS — Z1211 Encounter for screening for malignant neoplasm of colon: Secondary | ICD-10-CM | POA: Insufficient documentation

## 2021-08-21 HISTORY — DX: Unspecified osteoarthritis, unspecified site: M19.90

## 2021-08-21 HISTORY — PX: FLEXIBLE SIGMOIDOSCOPY: SHX5431

## 2021-08-21 SURGERY — SIGMOIDOSCOPY, FLEXIBLE
Anesthesia: Moderate Sedation

## 2021-08-21 MED ORDER — MIDAZOLAM HCL 5 MG/5ML IJ SOLN
INTRAMUSCULAR | Status: AC
  Filled 2021-08-21: qty 10

## 2021-08-21 MED ORDER — SODIUM CHLORIDE 0.9 % IV SOLN: INTRAVENOUS | Status: DC

## 2021-08-21 MED ORDER — MIDAZOLAM HCL 5 MG/5ML IJ SOLN
INTRAMUSCULAR | Status: DC | PRN
  Administered 2021-08-21 (×3): 1 mg via INTRAVENOUS
  Administered 2021-08-21: 2 mg via INTRAVENOUS

## 2021-08-21 MED ORDER — MEPERIDINE HCL 50 MG/ML IJ SOLN
INTRAMUSCULAR | Status: DC | PRN
  Administered 2021-08-21 (×2): 25 mg

## 2021-08-21 MED ORDER — MEPERIDINE HCL 50 MG/ML IJ SOLN
INTRAMUSCULAR | Status: AC
  Filled 2021-08-21: qty 1

## 2021-08-21 NOTE — H&P (Signed)
Amy Cook is an 65 y.o. female.   Chief Complaint: Patient is here for colonoscopy HPI: Patient is 65 year old Caucasian female who is here for screening colonoscopy.  She denies abdominal pain change in bowel habits or rectal bleeding.  This is patient's first exam.  Family history is negative for colon carcinoma.  She does not take aspirin or anticoagulants.  Patient tells me that she is scheduled for left hip replacement next week.  Past Medical History:  Diagnosis Date   Arthritis    Cataract 12/2016   both   Hypertension     Past Surgical History:  Procedure Laterality Date   ABDOMINAL HYSTERECTOMY  2001   complete   CESAREAN SECTION      Family History  Problem Relation Age of Onset   Depression Mother    Hypertension Mother    Hyperlipidemia Mother    Stroke Mother    Alcohol abuse Father    Heart disease Father    Hypertension Father    Hyperlipidemia Father    COPD Sister    Depression Sister    Alcohol abuse Brother    Depression Brother    Alcohol abuse Brother    Depression Brother    Depression Daughter    Alcohol abuse Maternal Uncle    Alcohol abuse Paternal Uncle    Colon cancer Neg Hx    Social History:  reports that she has never smoked. She has never used smokeless tobacco. She reports that she does not drink alcohol and does not use drugs.  Allergies:  Allergies  Allergen Reactions   Morphine And Related Itching    Medications Prior to Admission  Medication Sig Dispense Refill   acetaminophen (TYLENOL) 500 MG tablet Take 500 mg by mouth every 8 (eight) hours as needed for moderate pain.     lisinopril-hydrochlorothiazide (ZESTORETIC) 20-25 MG tablet Take 1 tablet by mouth daily.     polyethylene glycol-electrolytes (TRILYTE) 420 g solution Take 4,000 mLs by mouth as directed. 4000 mL 0    No results found for this or any previous visit (from the past 48 hour(s)). No results found.  Review of Systems  Blood pressure (!) 144/89,  pulse 64, temperature 97.6 F (36.4 C), temperature source Oral, resp. rate 16, height 5\' 4"  (1.626 m), weight 72.1 kg, SpO2 97 %. Physical Exam HENT:     Mouth/Throat:     Mouth: Mucous membranes are moist.     Pharynx: Oropharynx is clear.  Eyes:     General: No scleral icterus.    Conjunctiva/sclera: Conjunctivae normal.  Cardiovascular:     Rate and Rhythm: Normal rate and regular rhythm.     Heart sounds: Normal heart sounds. No murmur heard. Pulmonary:     Effort: Pulmonary effort is normal.     Breath sounds: Normal breath sounds.  Abdominal:     General: There is no distension.     Palpations: Abdomen is soft. There is no mass.     Tenderness: There is no abdominal tenderness.  Musculoskeletal:     Cervical back: Neck supple.  Lymphadenopathy:     Cervical: No cervical adenopathy.  Neurological:     Mental Status: She is alert.     Assessment/Plan  Average risk screening colonoscopy  , MD 08/21/2021, 10:27 AM

## 2021-08-21 NOTE — Op Note (Signed)
[facility] Patient Name: Amy Cook Procedure Date: 08/21/2021 10:21 AM MRN: [id number] Date of Birth: 1956/06/23 Attending MD: Lionel December , MD CSN: [id number] Age: 65 Admit Type: Outpatient Procedure:                Colonoscopy Indications:              Screening for colorectal malignant neoplasm Providers:                Lionel December, MD, Edrick Kins, RN, Pandora Leiter,                            Technician Referring MD:             Alvina Filbert, MD Medicines:                Meperidine 50 mg IV, Midazolam 6 mg IV Complications:            No immediate complications. Estimated Blood Loss:     Estimated blood loss: none. Procedure:                Pre-Anesthesia Assessment:                           - Prior to the procedure, a History and Physical                            was performed, and patient medications and                            allergies were reviewed. The patient's tolerance of                            previous anesthesia was also reviewed. The risks                            and benefits of the procedure and the sedation                            options and risks were discussed with the patient.                            All questions were answered, and informed consent                            was obtained. Prior Anticoagulants: The patient has                            taken no previous anticoagulant or antiplatelet                            agents. ASA Grade Assessment: II - A patient with                            mild systemic disease. After reviewing the risks  and benefits, the patient was deemed in                            satisfactory condition to undergo the procedure.                           After obtaining informed consent, the colonoscope                            was passed under direct vision. Throughout the                            procedure, the patient's blood pressure, pulse, and                             oxygen saturations were monitored continuously. The                            [id number]-HQ190L ([id number]) scope was introduced through                            the anus and advanced to the the sigmoid colon. The                            colonoscopy was performed with difficulty due to                            restricted mobility of the colon, significant                            looping and a tortuous colon. Pediatric colonoscope                            was removed and ultraslim scope was used but I was                            still not able to advance scope past the sigmoid                            colon secondary to tight loop which could not be                            reduced. Therefore examination was incomplete. The                            quality of the bowel preparation was excellent. The                            rectum and sigmoid colon were photographed. Scope In: 10:39:56 AM Scope Out: 11:03:37 AM Total Procedure Duration: 0 hours 23 minutes 41 seconds  Findings:      Skin tags were found on perianal exam.      A few small-mouthed diverticula were found in the sigmoid  colon.      External and internal hemorrhoids were found during retroflexion. The       hemorrhoids were small. Impression:               - Incomplete examination.                           - Perianal skin tags found on perianal exam.                           - Diverticulosis in the sigmoid colon.                           - External and internal hemorrhoids.                           - No specimens collected. Moderate Sedation:      Moderate (conscious) sedation was administered by the endoscopy nurse       and supervised by the endoscopist. The following parameters were       monitored: oxygen saturation, heart rate, blood pressure, CO2       capnography and response to care. Total physician intraservice time was       28 minutes. Recommendation:           - Patient has a  contact number available for                            emergencies. The signs and symptoms of potential                            delayed complications were discussed with the                            patient. Return to normal activities tomorrow.                            Written discharge instructions were provided to the                            patient.                           - High fiber diet today.                           - Continue present medications.                           - Perform a virtual colonoscopy at appointment to                            be scheduled. Procedure Code(s):        --- Professional ---                           [phone], 53, Colonoscopy, flexible; diagnostic,  including collection of specimen(s) by brushing or                            washing, when performed (separate procedure)                           99153, Moderate sedation; each additional 15                            minutes intraservice time                           G0500, Moderate sedation services provided by the                            same physician or other qualified health care                            professional performing a gastrointestinal                            endoscopic service that sedation supports,                            requiring the presence of an independent trained                            observer to assist in the monitoring of the                            patient's level of consciousness and physiological                            status; initial 15 minutes of intra-service time;                            patient age 59 years or older (additional time may                            be reported with 93810, as appropriate) Diagnosis Code(s):        --- Professional ---                           Z12.11, Encounter for screening for malignant                            neoplasm of colon                           K64.8,  Other hemorrhoids                           K64.4, Residual hemorrhoidal skin tags                           K57.30, Diverticulosis of  large intestine without                            perforation or abscess without bleeding CPT copyright 2019 American Medical Association. All rights reserved. The codes documented in this report are preliminary and upon coder review may  be revised to meet current compliance requirements. Lionel December, MD Lionel December, MD 08/21/2021 11:16:30 AM This report has been signed electronically. Number of Addenda: 0

## 2021-08-21 NOTE — Discharge Instructions (Addendum)
Resume usual medications. High-fiber diet. No driving for 24 hours. Virtual colonoscopy to be scheduled.  Office will contact you.

## 2021-08-27 ENCOUNTER — Encounter (HOSPITAL_COMMUNITY): Payer: Self-pay | Admitting: Internal Medicine

## 2021-09-02 ENCOUNTER — Other Ambulatory Visit (INDEPENDENT_AMBULATORY_CARE_PROVIDER_SITE_OTHER): Payer: Self-pay

## 2021-09-02 DIAGNOSIS — Z5309 Procedure and treatment not carried out because of other contraindication: Secondary | ICD-10-CM

## 2021-09-03 ENCOUNTER — Other Ambulatory Visit: Payer: Self-pay

## 2021-09-03 ENCOUNTER — Encounter (HOSPITAL_COMMUNITY): Payer: Self-pay | Admitting: Physical Therapy

## 2021-09-03 ENCOUNTER — Ambulatory Visit (HOSPITAL_COMMUNITY): Payer: Medicare Other | Attending: Orthopedic Surgery | Admitting: Physical Therapy

## 2021-09-03 DIAGNOSIS — R2689 Other abnormalities of gait and mobility: Secondary | ICD-10-CM | POA: Insufficient documentation

## 2021-09-03 DIAGNOSIS — M25552 Pain in left hip: Secondary | ICD-10-CM | POA: Insufficient documentation

## 2021-09-03 NOTE — Therapy (Signed)
[place], [place], [address] Phone: [phone]   Fax:  [phone]  Physical Therapy Evaluation  Patient Details  Name: Amy Cook MRN: [id number] Date of Birth: 1956/02/14 Referring Provider (PT): Dorna Leitz MD   Encounter Date: 09/03/2021   PT End of Session - 09/03/21 0823     Visit Number 1    Number of Visits 8    Date for PT Re-Evaluation 10/02/21    Authorization Type Medicare A/ generic commercial 2ndary    PT Start Time 0815    PT Stop Time 0855    PT Time Calculation (min) 40 min    Activity Tolerance Patient tolerated treatment well    Behavior During Therapy [facility] for tasks assessed/performed             Past Medical History:  Diagnosis Date   Arthritis    Cataract 12/2016   both   Hypertension     Past Surgical History:  Procedure Laterality Date   ABDOMINAL HYSTERECTOMY  2001   complete   CESAREAN SECTION     FLEXIBLE SIGMOIDOSCOPY N/A 08/21/2021   Procedure: FLEXIBLE SIGMOIDOSCOPY;  Surgeon: Rogene Houston, MD;  [facility];  Service: Endoscopy;  Laterality: N/A;    There were no vitals filed for this visit.    Subjective Assessment - 09/03/21 0818     Subjective Patient presents to therapy with complaint of LT hip pain s/p LT THA on 08/27/21. She is doing well. Pain was high initially but doing much better now. She is using straight cane intermittently but is ambulating fairly well with minimal use of AD. She is managing symptoms mostly with muscle relaxer, Celebrex and Tylenol.    Pertinent History LT THA  08/27/21    Limitations Sitting;Lifting;Standing;Walking;House hold activities    Patient Stated Goals be able to walk more/ better, get back to normal routine    Currently in Pain? Yes    Pain Score 2     Pain Location Hip    Pain Orientation Left    Pain Descriptors / Indicators Sore;Burning    Pain Type Surgical pain    Pain Onset In the past 7  days    Pain Frequency Intermittent    Aggravating Factors  WB, standing, walking    Pain Relieving Factors sitting, rest, meds    Effect of Pain on Daily Activities Limits                [facility] PT Assessment - 09/03/21 0001       Assessment   Medical Diagnosis LT THA    Referring Provider (PT) Dorna Leitz MD    Onset Date/Surgical Date 08/27/21    Prior Therapy No      Precautions   Precautions Anterior Hip      Restrictions   Weight Bearing Restrictions No      Balance Screen   Has the patient fallen in the past 6 months No      [place] residence    Living Arrangements Spouse/significant other      Prior Function   Level of Independence Independent      Cognition   Overall Cognitive Status Within Functional Limits for tasks assessed      Observation/Other Assessments   Focus on Therapeutic Outcomes (FOTO)  FOTO not uploaded at [place] / Strength   AROM / PROM / Strength  AROM;Strength      AROM   AROM Assessment Site Hip    Right/Left Hip Right;Left    Left Hip Extension 0    Left Hip Flexion 100      Strength   Strength Assessment Site Hip;Knee    Right/Left Hip Right;Left    Right Hip Flexion 5/5    Left Hip Flexion 4/5    Right/Left Knee Left;Right    Right Knee Extension 5/5    Left Knee Extension 4+/5      Ambulation/Gait   Ambulation/Gait Yes    Ambulation/Gait Assistance 6: Modified independent (Device/Increase time)    Assistive device Straight cane    Gait Pattern Decreased stride length;Decreased stance time - left;Decreased step length - right    Ambulation Surface Level;Indoor      Balance   Balance Assessed Yes      Static Standing Balance   Static Standing Balance -  Activities  Tandam Stance - Right Leg;Tandam Stance - Left Leg    Static Standing - Comment/# of Minutes 30 sec mod sway, 30 second                        Objective measurements completed on examination:  See above findings.       [place] Adult PT Treatment/Exercise - 09/03/21 0001       Exercises   Exercises Knee/Hip      Knee/Hip Exercises: Standing   Heel Raises Both;10 reps    Other Standing Knee Exercises mini squat x10      Knee/Hip Exercises: Supine   Quad Sets Left;5 reps    Heel Slides 10 reps    Other Supine Knee/Hip Exercises glute set 5 x5"                     PT Education - 09/03/21 0823     Education Details on evalaution findings, POC and HEP    Person(s) Educated Patient    Methods Explanation;Handout    Comprehension Verbalized understanding              PT Short Term Goals - 09/03/21 0844       PT SHORT TERM GOAL #1   Title Patient will be independent with initial HEP and self-management strategies to improve functional outcomes    Time 2    Period Weeks    Status New    Target Date 09/17/21               PT Long Term Goals - 09/03/21 0844       PT LONG TERM GOAL #1   Title Patient will be independent with advanced HEP and self-management strategies to improve functional outcomes    Time 4    Period Weeks    Status New    Target Date 10/01/21      PT LONG TERM GOAL #2   Title Patient will have equal to or > 4+/5 MMT throughout BLE to improve ability to perform functional mobility, stair ambulation and ADLs.    Time 4    Period Weeks    Status New    Target Date 10/01/21      PT LONG TERM GOAL #3   Title Patient will improve FOTO score to predicted value to indicate improvement in functional outcomes    Time 4    Period Weeks    Status New    Target Date 10/01/21  PT LONG TERM GOAL #4   Title Patient will be able to maintain single leg stance >20 seconds on BLEs to improve stability and reduce risk for falls    Time 4    Period Weeks    Status New    Target Date 10/01/21                    Plan - 09/03/21 0841     Clinical Impression Statement Patient is a 66 y.o. female who presents to  physical therapy with complaint of Lt hip pain s/p LT THA on 08/27/21. Patient demonstrates decreased strength, ROM restriction, balance deficits and gait abnormalities which are likely contributing to symptoms of pain and are negatively impacting patient ability to perform ADLs and functional mobility tasks. Patient will benefit from skilled physical therapy services to address these deficits to reduce pain, improve level of function with ADLs and functional mobility tasks.    Examination-Activity Limitations Locomotion Level;Transfers;Stairs;Squat;Stand    Examination-Participation Restrictions Yard Work;Cleaning;Meal Prep;Community Activity;Laundry;Shop    Stability/Clinical Decision Making Stable/Uncomplicated    Clinical Decision Making Low    Rehab Potential Good    PT Frequency 2x / week    PT Duration 4 weeks    PT Treatment/Interventions ADLs/Self Care Home Management;Biofeedback;Traction;Moist Heat;Ultrasound;Functional mobility training;Stair training;Balance training;Neuromuscular re-education;Manual techniques;Gait training;DME Instruction;Iontophoresis 4mg /ml Dexamethasone;Scar mobilization;Passive range of motion;Dry needling;Manual lymph drainage;Patient/family education;Therapeutic activities;Parrafin;Cryotherapy;Contrast Bath;Fluidtherapy;Electrical Stimulation;Therapeutic exercise;Orthotic Fit/Training;Compression bandaging;Vasopneumatic Device;Taping;Splinting;Joint Manipulations;Energy conservation;Spinal Manipulations    PT Next Visit Plan Complete FOTO. Review HEP. Progress funcitonal LE strengthening as tolerated. Standing exercise> static balance> dynamic> gait and stairs    PT Home Exercise Plan Eval: heel slide, glute set, quad set, ankle pump, heel raise, mini squat    Consulted and Agree with Plan of Care Patient             Patient will benefit from skilled therapeutic intervention in order to improve the following deficits and impairments:  Abnormal gait, Pain,  Improper body mechanics, Decreased mobility, Decreased activity tolerance, Decreased range of motion, Decreased strength, Hypomobility, Decreased balance, Difficulty walking  Visit Diagnosis: Pain in left hip  Other abnormalities of gait and mobility     Problem List Patient Active Problem List   Diagnosis Date Noted   Hypertension 04/12/2017   8:54 AM, 09/03/21 Josue Hector PT DPT  Physical Therapist with [facility]  [phone] [place], [place], [address] Phone: [phone]   Fax:  [phone]  Name: KORYNNE GALICA MRN: [id number] Date of Birth: March 07, 1956

## 2021-09-03 NOTE — Patient Instructions (Signed)
Access Code: [email or web address] URL: [email or web address] Date: 09/03/2021 Prepared by: Georges Lynch  Exercises Supine Quad Set - 2-3 x daily - 7 x weekly - 2 sets - 10 reps - 5 second hold Supine Gluteal Sets - 2-3 x daily - 7 x weekly - 2 sets - 10 reps - 5 second hold Supine Heel Slides - 2-3 x daily - 7 x weekly - 2 sets - 10 reps - 5 second hold Supine Ankle Pumps - 2-3 x daily - 7 x weekly - 2 sets - 10 reps Heel Raises with Counter Support - 2-3 x daily - 7 x weekly - 2 sets - 10 reps Mini Squat with Counter Support - 2-3 x daily - 7 x weekly - 2 sets - 10 reps Standing Knee Flexion with Counter Support - 2-3 x daily - 7 x weekly - 2 sets - 10 reps Standing Hip Abduction with Unilateral Counter Support - 2-3 x daily - 7 x weekly - 2 sets - 10 reps

## 2021-09-08 ENCOUNTER — Other Ambulatory Visit: Payer: Self-pay

## 2021-09-08 ENCOUNTER — Ambulatory Visit (HOSPITAL_COMMUNITY): Payer: Medicare Other | Admitting: Physical Therapy

## 2021-09-08 ENCOUNTER — Encounter (HOSPITAL_COMMUNITY): Payer: Self-pay | Admitting: Physical Therapy

## 2021-09-08 DIAGNOSIS — M25552 Pain in left hip: Secondary | ICD-10-CM | POA: Diagnosis not present

## 2021-09-08 DIAGNOSIS — R2689 Other abnormalities of gait and mobility: Secondary | ICD-10-CM

## 2021-09-08 NOTE — Therapy (Signed)
[facility] [address] [place], [place], [address] Phone: [phone]   Fax:  [phone]  Physical Therapy Treatment  Patient Details  Name: CARLETA WOODROW MRN: [id number] Date of Birth: [birth year] Referring Provider (PT): Jodi Geralds MD   Encounter Date: 09/08/2021   PT End of Session - 09/08/21 1007     Visit Number 2    Number of Visits 8    Date for PT Re-Evaluation 10/02/21    Authorization Type Medicare A/ generic commercial 2ndary    PT Start Time 0749    PT Stop Time 0827    PT Time Calculation (min) 38 min    Activity Tolerance Patient tolerated treatment well    Behavior During Therapy [facility] for tasks assessed/performed             Past Medical History:  Diagnosis Date   Arthritis    Cataract 12/2016   both   Hypertension     Past Surgical History:  Procedure Laterality Date   ABDOMINAL HYSTERECTOMY  2001   complete   CESAREAN SECTION     FLEXIBLE SIGMOIDOSCOPY N/A 08/21/2021   Procedure: FLEXIBLE SIGMOIDOSCOPY;  Surgeon: Malissa Hippo, MD;  Location[facility];  Service: Endoscopy;  Laterality: N/A;    There were no vitals filed for this visit.   Subjective Assessment - 09/08/21 0749     Subjective Patient reports that her pain level has been well controlled with the use of her medications. She states that she was able to walk ~64mile with the amount of shopping that was done over the weekend.    Pertinent History LT THA  08/27/21    Limitations Sitting;Lifting;Standing;Walking;House hold activities    Patient Stated Goals be able to walk more/ better, get back to normal routine    Currently in Pain? No/denies    Pain Score 0-No pain    Pain Location Hip    Pain Onset In the past 7 days                               [facility] Adult PT Treatment/Exercise - 09/08/21 0001       Ambulation/Gait   Ambulation/Gait Yes    Ambulation/Gait Assistance 7: Independent    Gait  Pattern Decreased stride length    Ambulation Surface Level;Indoor      Knee/Hip Exercises: Aerobic   Recumbent Bike x31min      Knee/Hip Exercises: Standing   Stairs 2x4RT 4in and 2x4RT 7in    Other Standing Knee Exercises Banded(green) hip abd arcs 3x12    Other Standing Knee Exercises High knee in PBs x3RT                     PT Education - 09/08/21 1017     Education Details stair negotiation mechanics, updated HEP, hip precautions, and post surgical healing timelines.    Person(s) Educated Patient    Methods Explanation    Comprehension Verbalized understanding              PT Short Term Goals - 09/03/21 0844       PT SHORT TERM GOAL #1   Title Patient will be independent with initial HEP and self-management strategies to improve functional outcomes    Time 2    Period Weeks    Status New    Target Date 09/17/21  PT Long Term Goals - 09/03/21 0844       PT LONG TERM GOAL #1   Title Patient will be independent with advanced HEP and self-management strategies to improve functional outcomes    Time 4    Period Weeks    Status New    Target Date 10/01/21      PT LONG TERM GOAL #2   Title Patient will have equal to or > 4+/5 MMT throughout BLE to improve ability to perform functional mobility, stair ambulation and ADLs.    Time 4    Period Weeks    Status New    Target Date 10/01/21      PT LONG TERM GOAL #3   Title Patient will improve FOTO score to predicted value to indicate improvement in functional outcomes    Time 4    Period Weeks    Status New    Target Date 10/01/21      PT LONG TERM GOAL #4   Title Patient will be able to maintain single leg stance >20 seconds on BLEs to improve stability and reduce risk for falls    Time 4    Period Weeks    Status New    Target Date 10/01/21                   Plan - 09/08/21 1008     Clinical Impression Statement Patient tolerated treatment well during today's  session although she did require a moderate amount of cueing for the banded hip abd arc exercise. Stair negotiation was performed well as she was able to use a reciprocal pattern on both the 4in and 7in steps. A vaulting step off of the RLE is noted during stair ascent, but this was able to be limited following minimal verbal and visual cueing.    Examination-Activity Limitations Locomotion Level;Transfers;Stairs;Squat;Stand    Examination-Participation Restrictions Yard Work;Cleaning;Meal Prep;Community Activity;Laundry;Shop    Stability/Clinical Decision Making Stable/Uncomplicated    Rehab Potential Good    PT Frequency 2x / week    PT Duration 4 weeks    PT Treatment/Interventions ADLs/Self Care Home Management;Biofeedback;Traction;Moist Heat;Ultrasound;Functional mobility training;Stair training;Balance training;Neuromuscular re-education;Manual techniques;Gait training;DME Instruction;Iontophoresis 4mg /ml Dexamethasone;Scar mobilization;Passive range of motion;Dry needling;Manual lymph drainage;Patient/family education;Therapeutic activities;Parrafin;Cryotherapy;Contrast Bath;Fluidtherapy;Electrical Stimulation;Therapeutic exercise;Orthotic Fit/Training;Compression bandaging;Vasopneumatic Device;Taping;Splinting;Joint Manipulations;Energy conservation;Spinal Manipulations    PT Next Visit Plan Complete FOTO. Progress funcitonal LE strengthening as tolerated. Dynamic standing exercise and gait/stairs.    PT Home Exercise Plan Eval: heel slide, glute set, quad set, ankle pump, heel raise, mini squat, standing banded(green) hip abd arcs    Consulted and Agree with Plan of Care Patient             Patient will benefit from skilled therapeutic intervention in order to improve the following deficits and impairments:  Abnormal gait, Pain, Improper body mechanics, Decreased mobility, Decreased activity tolerance, Decreased range of motion, Decreased strength, Hypomobility, Decreased balance,  Difficulty walking  Visit Diagnosis: Pain in left hip  Other abnormalities of gait and mobility     Problem List Patient Active Problem List   Diagnosis Date Noted   Hypertension 04/12/2017    10:19 AM,09/08/21 09/10/21, DPT Creig Hines [facility] Physical Therapy   [facility] [address] [place], [place], [place] Phone: [phone]   Fax:  [phone]  Name: TYONNA TALERICO MRN: Jenny Reichmann Date of Birth: [birth year]

## 2021-09-10 ENCOUNTER — Encounter (HOSPITAL_COMMUNITY): Payer: Self-pay | Admitting: Physical Therapy

## 2021-09-10 ENCOUNTER — Other Ambulatory Visit: Payer: Self-pay

## 2021-09-10 ENCOUNTER — Ambulatory Visit (HOSPITAL_COMMUNITY): Payer: Medicare Other | Admitting: Physical Therapy

## 2021-09-10 DIAGNOSIS — M25552 Pain in left hip: Secondary | ICD-10-CM

## 2021-09-10 DIAGNOSIS — R2689 Other abnormalities of gait and mobility: Secondary | ICD-10-CM

## 2021-09-10 NOTE — Therapy (Signed)
[facility] [address] [place], [place], [address] Phone: [phone]   Fax:  [phone]  Physical Therapy Treatment  Patient Details  Name: Amy Cook MRN: [id number] Date of Birth: Jun 06, 1956 Referring Provider (PT): Jodi Geralds MD   Encounter Date: 09/10/2021   PT End of Session - 09/10/21 1324     Visit Number 3    Number of Visits 8    Date for PT Re-Evaluation 10/02/21    Authorization Type Medicare A/ generic commercial 2ndary    PT Start Time 0817    PT Stop Time 0845    PT Time Calculation (min) 28 min    Activity Tolerance Patient tolerated treatment well    Behavior During Therapy [facility] for tasks assessed/performed             Past Medical History:  Diagnosis Date   Arthritis    Cataract 12/2016   both   Hypertension     Past Surgical History:  Procedure Laterality Date   ABDOMINAL HYSTERECTOMY  2001   complete   CESAREAN SECTION     FLEXIBLE SIGMOIDOSCOPY N/A 08/21/2021   Procedure: FLEXIBLE SIGMOIDOSCOPY;  Surgeon: Malissa Hippo, MD;  [facility];  Service: Endoscopy;  Laterality: N/A;    There were no vitals filed for this visit.   Subjective Assessment - 09/10/21 0820     Subjective Patient states she had follow up yesterday which went very well. She has been cleared to drive. She is doing very good with no pain currently. She requests visits be shortened because she has to leave by 845A.    Pertinent History LT THA  08/27/21    Limitations Sitting;Lifting;Standing;Walking;House hold activities    Patient Stated Goals be able to walk more/ better, get back to normal routine    Currently in Pain? No/denies    Pain Onset In the past 7 days                               [facility] Adult PT Treatment/Exercise - 09/10/21 0001       Knee/Hip Exercises: Aerobic   Recumbent Bike 4 min dynamic warmup Lv 3      Knee/Hip Exercises: Standing   Heel Raises 15 reps     Hip Abduction 2 sets;10 reps    Abduction Limitations RTB    Forward Step Up 2 sets;10 reps;Both;Hand Hold: 0;Step Height: 6"    Step Down Both;2 sets;10 reps;Hand Hold: 1;Step Height: 6"    SLS 3 x 15" each ( 22 sec max)    Other Standing Knee Exercises sidestepping in // bars 4 RT no HHA    Other Standing Knee Exercises tandem stance on foam 2 x 30"      Knee/Hip Exercises: Seated   Sit to Sand 2 sets;10 reps;without UE support                       PT Short Term Goals - 09/03/21 0844       PT SHORT TERM GOAL #1   Title Patient will be independent with initial HEP and self-management strategies to improve functional outcomes    Time 2    Period Weeks    Status New    Target Date 09/17/21               PT Long Term Goals - 09/03/21 4010  PT LONG TERM GOAL #1   Title Patient will be independent with advanced HEP and self-management strategies to improve functional outcomes    Time 4    Period Weeks    Status New    Target Date 10/01/21      PT LONG TERM GOAL #2   Title Patient will have equal to or > 4+/5 MMT throughout BLE to improve ability to perform functional mobility, stair ambulation and ADLs.    Time 4    Period Weeks    Status New    Target Date 10/01/21      PT LONG TERM GOAL #3   Title Patient will improve FOTO score to predicted value to indicate improvement in functional outcomes    Time 4    Period Weeks    Status New    Target Date 10/01/21      PT LONG TERM GOAL #4   Title Patient will be able to maintain single leg stance >20 seconds on BLEs to improve stability and reduce risk for falls    Time 4    Period Weeks    Status New    Target Date 10/01/21                   Plan - 09/10/21 0848     Clinical Impression Statement Patient progressing very well. Showing improved functional mobility and static balance. Added foam pad to challenge static balance. Increased step height for step ups and downs for LE  strength progression. Issued updated HEP handout. Patient will continue to benefit from skilled therapy services to reduce deficits and improve functional ability.    Examination-Activity Limitations Locomotion Level;Transfers;Stairs;Squat;Stand    Examination-Participation Restrictions Yard Work;Cleaning;Meal Prep;Community Activity;Laundry;Shop    Stability/Clinical Decision Making Stable/Uncomplicated    Rehab Potential Good    PT Frequency 2x / week    PT Duration 4 weeks    PT Treatment/Interventions ADLs/Self Care Home Management;Biofeedback;Traction;Moist Heat;Ultrasound;Functional mobility training;Stair training;Balance training;Neuromuscular re-education;Manual techniques;Gait training;DME Instruction;Iontophoresis 4mg /ml Dexamethasone;Scar mobilization;Passive range of motion;Dry needling;Manual lymph drainage;Patient/family education;Therapeutic activities;Parrafin;Cryotherapy;Contrast Bath;Fluidtherapy;Electrical Stimulation;Therapeutic exercise;Orthotic Fit/Training;Compression bandaging;Vasopneumatic Device;Taping;Splinting;Joint Manipulations;Energy conservation;Spinal Manipulations    PT Next Visit Plan Progress funcitonal LE strengthening as tolerated. Dynamic standing exercise and gait/stairs. SLS Vectors    PT Home Exercise Plan Eval: heel slide, glute set, quad set, ankle pump, heel raise, mini squat, standing banded(green) hip abd arcs 1/18 hip abduction, tandem stance, SLS    Consulted and Agree with Plan of Care Patient             Patient will benefit from skilled therapeutic intervention in order to improve the following deficits and impairments:  Abnormal gait, Pain, Improper body mechanics, Decreased mobility, Decreased activity tolerance, Decreased range of motion, Decreased strength, Hypomobility, Decreased balance, Difficulty walking  Visit Diagnosis: Pain in left hip  Other abnormalities of gait and mobility     Problem List Patient Active Problem List    Diagnosis Date Noted   Hypertension 04/12/2017   8:51 AM, 09/10/21 09/12/21 PT DPT  Physical Therapist with   [facility]  [phone]  [facility] [address] [place], [place], [place] Phone: [phone]   Fax:  [phone]  Name: Amy Cook MRN: Jenny Reichmann Date of Birth: 10-23-1955

## 2021-09-10 NOTE — Patient Instructions (Signed)
Access Code: [email or web address] URL: [email or web address] Date: 09/10/2021 Prepared by: Georges Lynch  Exercises Hip Abduction with Resistance Loop - 2 x daily - 7 x weekly - 2 sets - 10 reps Single Leg Stance with Support - 2 x daily - 7 x weekly - 1 sets - 3 reps - 20-30 seconds hold Tandem Stance with Support - 2 x daily - 7 x weekly - 1 sets - 3 reps - 20-30 seconds hold

## 2021-09-15 ENCOUNTER — Encounter (HOSPITAL_COMMUNITY): Payer: Self-pay | Admitting: Physical Therapy

## 2021-09-15 ENCOUNTER — Other Ambulatory Visit: Payer: Self-pay

## 2021-09-15 ENCOUNTER — Ambulatory Visit (HOSPITAL_COMMUNITY): Payer: Medicare Other | Admitting: Physical Therapy

## 2021-09-15 DIAGNOSIS — M25552 Pain in left hip: Secondary | ICD-10-CM | POA: Diagnosis not present

## 2021-09-15 DIAGNOSIS — R2689 Other abnormalities of gait and mobility: Secondary | ICD-10-CM

## 2021-09-15 NOTE — Therapy (Signed)
[facility] [address] [place], [place], [address] Phone: [phone]   Fax:  [phone]  Physical Therapy Treatment  Patient Details  Name: Amy Cook MRN: [id number] Date of Birth: [birth year] Referring Provider (PT): Jodi Geralds MD   Encounter Date: 09/15/2021   PT End of Session - 09/15/21 0833     Visit Number 4    Number of Visits 8    Date for PT Re-Evaluation 10/02/21    Authorization Type Medicare A/ generic commercial 2ndary    PT Start Time [id number]    PT Stop Time 0913    PT Time Calculation (min) 38 min    Activity Tolerance Patient tolerated treatment well    Behavior During Therapy [facility] for tasks assessed/performed             Past Medical History:  Diagnosis Date   Arthritis    Cataract 12/2016   both   Hypertension     Past Surgical History:  Procedure Laterality Date   ABDOMINAL HYSTERECTOMY  2001   complete   CESAREAN SECTION     FLEXIBLE SIGMOIDOSCOPY N/A 08/21/2021   Procedure: FLEXIBLE SIGMOIDOSCOPY;  Surgeon: Malissa Hippo, MD;  Location[facility];  Service: Endoscopy;  Laterality: N/A;    There were no vitals filed for this visit.   Subjective Assessment - 09/15/21 0834     Subjective Patient states everything is going well. Her HEP is going well.    Pertinent History LT THA  08/27/21    Limitations Sitting;Lifting;Standing;Walking;House hold activities    Patient Stated Goals be able to walk more/ better, get back to normal routine    Currently in Pain? No/denies    Pain Onset In the past 7 days                               [facility] Adult PT Treatment/Exercise - 09/15/21 0001       Knee/Hip Exercises: Aerobic   Recumbent Bike 5 min dynamic warmup Lv 4      Knee/Hip Exercises: Standing   Lateral Step Up Left;2 sets;10 reps;Hand Hold: 2;Step Height: 6"    Lateral Step Up Limitations eccentric control    Forward Step Up Left;2 sets;Hand Hold: 1;Step  Height: 6";15 reps    Stairs 2x 5 RT 7 inch    SLS with Vectors 5x 5 second holds bilateral    Other Standing Knee Exercises sidestepping with mini squat in // bars 5 RT no HHA x 3    Other Standing Knee Exercises retro stepping with resistance 3 plates x 10      Knee/Hip Exercises: Seated   Sit to Sand 10 reps;without UE support   4 sets                    PT Education - 09/15/21 0834     Education Details HEP    Person(s) Educated Patient    Methods Explanation;Demonstration    Comprehension Verbalized understanding;Returned demonstration              PT Short Term Goals - 09/03/21 0844       PT SHORT TERM GOAL #1   Title Patient will be independent with initial HEP and self-management strategies to improve functional outcomes    Time 2    Period Weeks    Status New    Target Date 09/17/21  PT Long Term Goals - 09/03/21 0844       PT LONG TERM GOAL #1   Title Patient will be independent with advanced HEP and self-management strategies to improve functional outcomes    Time 4    Period Weeks    Status New    Target Date 10/01/21      PT LONG TERM GOAL #2   Title Patient will have equal to or > 4+/5 MMT throughout BLE to improve ability to perform functional mobility, stair ambulation and ADLs.    Time 4    Period Weeks    Status New    Target Date 10/01/21      PT LONG TERM GOAL #3   Title Patient will improve FOTO score to predicted value to indicate improvement in functional outcomes    Time 4    Period Weeks    Status New    Target Date 10/01/21      PT LONG TERM GOAL #4   Title Patient will be able to maintain single leg stance >20 seconds on BLEs to improve stability and reduce risk for falls    Time 4    Period Weeks    Status New    Target Date 10/01/21                   Plan - 09/15/21 0834     Clinical Impression Statement Began session with bike for dynamic warm up. Patient progressing well with PT  intervention but continues to lack eccentric hip/quad strength. She demonstrates improving single leg balance but does require HHA with vectors. Patient given intermittent cueing for exercise mechanics and positioning with good carry over. Patient feels pretty good with functional status, reassess and likely d/c next session. Patient will continue to benefit from physical therapy in order to reduce impairment and improve function.    Examination-Activity Limitations Locomotion Level;Transfers;Stairs;Squat;Stand    Examination-Participation Restrictions Yard Work;Cleaning;Meal Prep;Community Activity;Laundry;Shop    Stability/Clinical Decision Making Stable/Uncomplicated    Rehab Potential Good    PT Frequency 2x / week    PT Duration 4 weeks    PT Treatment/Interventions ADLs/Self Care Home Management;Biofeedback;Traction;Moist Heat;Ultrasound;Functional mobility training;Stair training;Balance training;Neuromuscular re-education;Manual techniques;Gait training;DME Instruction;Iontophoresis 4mg /ml Dexamethasone;Scar mobilization;Passive range of motion;Dry needling;Manual lymph drainage;Patient/family education;Therapeutic activities;Parrafin;Cryotherapy;Contrast Bath;Fluidtherapy;Electrical Stimulation;Therapeutic exercise;Orthotic Fit/Training;Compression bandaging;Vasopneumatic Device;Taping;Splinting;Joint Manipulations;Energy conservation;Spinal Manipulations    PT Next Visit Plan Likely d/c next session. Progress funcitonal LE strengthening as tolerated. Dynamic standing exercise and gait/stairs.    PT Home Exercise Plan Eval: heel slide, glute set, quad set, ankle pump, heel raise, mini squat, standing banded(green) hip abd arcs 1/18 hip abduction, tandem stance, SLS    Consulted and Agree with Plan of Care Patient             Patient will benefit from skilled therapeutic intervention in order to improve the following deficits and impairments:  Abnormal gait, Pain, Improper body mechanics,  Decreased mobility, Decreased activity tolerance, Decreased range of motion, Decreased strength, Hypomobility, Decreased balance, Difficulty walking  Visit Diagnosis: Pain in left hip  Other abnormalities of gait and mobility     Problem List Patient Active Problem List   Diagnosis Date Noted   Hypertension 04/12/2017    9:09 AM, 09/15/21 Wyman SongsterAndrew S. Yeilin Zweber PT, DPT [facility]   [facility] [address] [place], [place], [address] Phone: [phone]   Fax:  [phone]  Name: Amy Cook MRN: [id number]  Date of Birth: [birth year]

## 2021-09-17 ENCOUNTER — Ambulatory Visit (HOSPITAL_COMMUNITY): Payer: Medicare Other | Admitting: Physical Therapy

## 2021-09-17 ENCOUNTER — Other Ambulatory Visit: Payer: Self-pay

## 2021-09-17 ENCOUNTER — Encounter (HOSPITAL_COMMUNITY): Payer: Self-pay | Admitting: Physical Therapy

## 2021-09-17 DIAGNOSIS — M25552 Pain in left hip: Secondary | ICD-10-CM | POA: Diagnosis not present

## 2021-09-17 DIAGNOSIS — R2689 Other abnormalities of gait and mobility: Secondary | ICD-10-CM

## 2021-09-17 NOTE — Therapy (Signed)
[place] [address] [place], [place], [address] Phone: [phone]   Fax:  [phone]  Physical Therapy Treatment  Patient Details  Name: Amy Cook MRN: [id number] Date of Birth: September 27, 1955 Referring Provider (PT): Dorna Leitz MD  PHYSICAL THERAPY DISCHARGE SUMMARY  Visits from Start of Care: 5  Current functional level related to goals / functional outcomes: See below    Remaining deficits: See below    Education / Equipment: See assessment    Patient agrees to discharge. Patient goals were met. Patient is being discharged due to being pleased with the current functional level.  Encounter Date: 09/17/2021   PT End of Session - 09/17/21 9242     Visit Number 5    Number of Visits 8    Date for PT Re-Evaluation 10/02/21    Authorization Type Medicare A/ generic commercial 2ndary    PT Start Time 0817    PT Stop Time 0858    PT Time Calculation (min) 41 min    Activity Tolerance Patient tolerated treatment well    Behavior During Therapy [facility] for tasks assessed/performed             Past Medical History:  Diagnosis Date   Arthritis    Cataract 12/2016   both   Hypertension     Past Surgical History:  Procedure Laterality Date   ABDOMINAL HYSTERECTOMY  2001   complete   CESAREAN SECTION     FLEXIBLE SIGMOIDOSCOPY N/A 08/21/2021   Procedure: FLEXIBLE SIGMOIDOSCOPY;  Surgeon: Rogene Houston, MD;  Location[facility];  Service: Endoscopy;  Laterality: N/A;    There were no vitals filed for this visit.   Subjective Assessment - 09/17/21 0820     Subjective Patient says she is doing very well. She is ready for today to be her last day. No pain.    Pertinent History LT THA  08/27/21    Limitations Sitting;Lifting;Standing;Walking;House hold activities    Patient Stated Goals be able to walk more/ better, get back to normal routine    Currently in Pain? No/denies    Pain Onset In the past 7 days                 [facility] PT Assessment - 09/17/21 0001       Assessment   Medical Diagnosis LT THA    Referring Provider (PT) Dorna Leitz MD    Onset Date/Surgical Date 08/27/21    Next MD Visit 09/29/21    Prior Therapy No      Precautions   Precautions Anterior Hip      Restrictions   Weight Bearing Restrictions No      Balance Screen   Has the patient fallen in the past 6 months No      [place] residence      Prior Function   Level of Independence Independent      Cognition   Overall Cognitive Status Within Functional Limits for tasks assessed      Observation/Other Assessments   Observations small opening at proximal incision, discussed closely monitoring this area, keep clean and covered. Call MD office if anything changes, noting any flevers, chills, increased redness around area    Focus on Therapeutic Outcomes (FOTO)  FOTO never entered      Strength   Right Hip Flexion 5/5    Right Hip Extension 5/5    Right Hip ABduction 4+/5  Left Hip Flexion 4+/5    Left Hip Extension 4+/5    Left Hip ABduction 4+/5    Right Knee Extension 5/5    Left Knee Extension 4+/5      Ambulation/Gait   Ambulation/Gait Yes    Ambulation/Gait Assistance 7: Independent    Assistive device None    Gait Pattern Step-through pattern    Ambulation Surface Level;Indoor    Stairs Yes    Stairs Assistance 7: Independent    Stair Management Technique No rails;Alternating pattern    Number of Stairs 12    Height of Stairs 7      Static Standing Balance   Static Standing Balance -  Activities  Single Leg Stance - Right Leg;Single Leg Stance - Left Leg    Static Standing - Comment/# of Minutes >20 second bilateral, did require several attempts on LLE                           [facility] Adult PT Treatment/Exercise - 09/17/21 0001       Knee/Hip Exercises: Aerobic   Recumbent Bike 5 min dynamic warmup Lv 4      Knee/Hip  Exercises: Standing   Heel Raises 20 reps    Hip Abduction 2 sets;10 reps    Functional Squat 2 sets;10 reps    Functional Squat Limitations partial range    Stairs 5 RT 7 inch no rails reciprocal    SLS 3 x 10"                       PT Short Term Goals - 09/17/21 0901       PT SHORT TERM GOAL #1   Title Patient will be independent with initial HEP and self-management strategies to improve functional outcomes    Baseline Reports compliance, demos good return    Time 2    Period Weeks    Status Achieved    Target Date 09/17/21               PT Long Term Goals - 09/17/21 0907       PT LONG TERM GOAL #1   Title Patient will be independent with advanced HEP and self-management strategies to improve functional outcomes    Baseline Reviewed and answered all questions. Issued comprehensive HEP    Time 4    Period Weeks    Status Achieved    Target Date 10/01/21      PT LONG TERM GOAL #2   Title Patient will have equal to or > 4+/5 MMT throughout BLE to improve ability to perform functional mobility, stair ambulation and ADLs.    Baseline See MMT    Time 4    Period Weeks    Status Achieved    Target Date 10/01/21      PT LONG TERM GOAL #3   Title Patient will improve FOTO score to predicted value to indicate improvement in functional outcomes    Time 4    Period Weeks    Status Deferred    Target Date 10/01/21      PT LONG TERM GOAL #4   Title Patient will be able to maintain single leg stance >20 seconds on BLEs to improve stability and reduce risk for falls    Baseline Able to hold > 20 seconds bilat    Time 4    Period Weeks    Status Achieved  Target Date 10/01/21                   Plan - 09/17/21 0849     Clinical Impression Statement Patient has made very good progress to therapy goals and has met long term goals. FOTO goal deferred because survey was never uploaded into system. Patient requesting DC today, she feels she has  returned to baseline with most ADLs and participates in weekly group exercise classes. Reviewed anterior hip precautions and to avoid extremes of end ROM. Also noted opening at proximal incision and to keep a close eye on this to avoid becoming infected. Instructed patient to follow up with MD office with any further questions regarding wound care. She does have follow up in less than 2 weeks. Reviewed HEP and answered all patient questions. Patient being DC today per self-request with goals met/ partially met. Patient encouraged patient to follow up with therapy services with any further questions or concerns.    Examination-Activity Limitations Locomotion Level;Transfers;Stairs;Squat;Stand    Examination-Participation Restrictions Yard Work;Cleaning;Meal Prep;Community Activity;Laundry;Shop    Stability/Clinical Decision Making Stable/Uncomplicated    Rehab Potential Good    PT Frequency 2x / week    PT Duration 4 weeks    PT Treatment/Interventions ADLs/Self Care Home Management;Biofeedback;Traction;Moist Heat;Ultrasound;Functional mobility training;Stair training;Balance training;Neuromuscular re-education;Manual techniques;Gait training;DME Instruction;Iontophoresis 51m/ml Dexamethasone;Scar mobilization;Passive range of motion;Dry needling;Manual lymph drainage;Patient/family education;Therapeutic activities;Parrafin;Cryotherapy;Contrast Bath;Fluidtherapy;Electrical Stimulation;Therapeutic exercise;Orthotic Fit/Training;Compression bandaging;Vasopneumatic Device;Taping;Splinting;Joint Manipulations;Energy conservation;Spinal Manipulations    PT Next Visit Plan DC to HEP    PT Home Exercise Plan Eval: heel slide, glute set, quad set, ankle pump, heel raise, mini squat, standing banded(green) hip abd arcs 1/18 hip abduction, tandem stance, SLS    Consulted and Agree with Plan of Care Patient             Patient will benefit from skilled therapeutic intervention in order to improve the following  deficits and impairments:  Abnormal gait, Pain, Improper body mechanics, Decreased mobility, Decreased activity tolerance, Decreased range of motion, Decreased strength, Hypomobility, Decreased balance, Difficulty walking  Visit Diagnosis: Pain in left hip  Other abnormalities of gait and mobility     Problem List Patient Active Problem List   Diagnosis Date Noted   Hypertension 04/12/2017   9:23 AM, 09/17/21 CJosue HectorPT DPT  [facility] [phone] [place] [address]Phone: [phone]  Fax:  [phone] Name: Amy BRADDYMRN: [id number] of Birth: [birth year]

## 2021-09-17 NOTE — Patient Instructions (Signed)
Access Code: [email or web address] URL: [email or web address] Date: 09/17/2021 Prepared by: Josue Hector  Exercises Standing Heel Raise with Support - 1-2 x daily - 5 x weekly - 2 sets - 10 reps Standing Hip Abduction - 1-2 x daily - 5 x weekly - 2 sets - 10 reps Mini Squat with Counter Support - 1-2 x daily - 5 x weekly - 2 sets - 10 reps Standing Knee Flexion Strengthening at Chair - 1-2 x daily - 5 x weekly - 2 sets - 10 reps Forward Step Up - 1-2 x daily - 5 x weekly - 2 sets - 10 reps Forward Step Down Touch with Heel - 1-2 x daily - 5 x weekly - 2 sets - 10 reps Tandem Stance with Support - 1-2 x daily - 5 x weekly - 1 sets - 3 reps - 20-30 second hold Single Leg Stance with Support - 1-2 x daily - 5 x weekly - 1 sets - 3 reps - 20-30 second hold

## 2021-09-22 ENCOUNTER — Encounter (HOSPITAL_COMMUNITY): Payer: PRIVATE HEALTH INSURANCE | Admitting: Physical Therapy

## 2021-10-01 ENCOUNTER — Encounter (HOSPITAL_COMMUNITY): Payer: PRIVATE HEALTH INSURANCE | Admitting: Physical Therapy

## 2021-10-03 ENCOUNTER — Ambulatory Visit
Admission: RE | Admit: 2021-10-03 | Discharge: 2021-10-03 | Disposition: A | Discharge status: Home / Self Care | Payer: Medicare Other | Source: Ambulatory Visit | Attending: Internal Medicine | Admitting: Internal Medicine

## 2021-10-03 DIAGNOSIS — Z5309 Procedure and treatment not carried out because of other contraindication: Secondary | ICD-10-CM

## 2022-01-28 ENCOUNTER — Encounter: Payer: Self-pay | Admitting: *Deleted

## 2022-12-08 ENCOUNTER — Ambulatory Visit (HOSPITAL_COMMUNITY): Payer: Medicare Other | Attending: Orthopedic Surgery

## 2022-12-08 ENCOUNTER — Other Ambulatory Visit: Payer: Self-pay

## 2022-12-08 DIAGNOSIS — M25551 Pain in right hip: Secondary | ICD-10-CM

## 2022-12-08 DIAGNOSIS — R262 Difficulty in walking, not elsewhere classified: Secondary | ICD-10-CM

## 2022-12-08 NOTE — Therapy (Signed)
OUTPATIENT PHYSICAL THERAPY LOWER EXTREMITY EVALUATION   Patient Name: Amy Cook MRN: [id number] DOB:[birth year], 67 y.o., female Today's Date: 12/08/2022  END OF SESSION:  PT End of Session - 12/08/22 0951     Visit Number 1    Number of Visits 6    Date for PT Re-Evaluation 12/29/22    Authorization Type Medicare part A and [employer]    Progress Note Due on Visit 10    PT Start Time 0950    PT Stop Time 1030    PT Time Calculation (min) 40 min    Activity Tolerance Patient tolerated treatment well    Behavior During Therapy [facility] for tasks assessed/performed             Past Medical History:  Diagnosis Date   Arthritis    Cataract 12/2016   both   Hypertension    Past Surgical History:  Procedure Laterality Date   ABDOMINAL HYSTERECTOMY  2001   complete   CESAREAN SECTION     FLEXIBLE SIGMOIDOSCOPY N/A 08/21/2021   Procedure: FLEXIBLE SIGMOIDOSCOPY;  Surgeon: Malissa Hippo, MD;  [facility]: [facility];  Service: Endoscopy;  Laterality: N/A;   Patient Active Problem List   Diagnosis Date Noted   Hypertension 04/12/2017    PCP: Alvina Filbert, MD  REFERRING PROVIDER: Jodi Geralds, MD  REFERRING DIAG: 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 Yes: NPRS scale: 4-5/10 Pain location: right anterior hip Aggravating factors: none noted Relieving factors: Tylenol  PRECAUTIONS: None  WEIGHT BEARING RESTRICTIONS:  No  FALLS:  Has patient fallen in last 6 months? Yes. Number of falls 1  LIVING ENVIRONMENT: Lives with: lives with their spouse Lives in: House/apartment Stairs: Yes: External: 4 steps; none Has following equipment at home: Single point cane and Walker - 2 wheeled  OCCUPATION: not working; retired  PLOF: Independent  PATIENT GOALS: to walk without pain; be able to hike  NEXT MD VISIT: today to have bandage taken off  OBJECTIVE:   DIAGNOSTIC FINDINGS: none  PATIENT SURVEYS:  FOTO 46  COGNITION: Overall cognitive status: Within functional limits for tasks assessed     SENSATION: WFL  EDEMA:  Yes normal for this time s/p right hip and right leg    PALPATION: Tender at and around incision  LOWER EXTREMITY ROM:  Active ROM Right eval Left eval  Hip flexion 62   Hip extension    Hip abduction    Hip adduction    Hip internal rotation    Hip external rotation    Knee flexion 135   Knee extension    Ankle dorsiflexion    Ankle plantarflexion    Ankle inversion    Ankle eversion     (Blank rows = not tested)  LOWER EXTREMITY MMT:  MMT Right eval Left eval  Hip flexion 3+   Hip extension    Hip abduction    Hip adduction    Hip  internal rotation    Hip external rotation    Knee flexion    Knee extension 4   Ankle dorsiflexion 4+   Ankle plantarflexion    Ankle inversion    Ankle eversion     (Blank rows = not tested)  FUNCTIONAL TESTS:  5 times sit to stand: 14.55 sec 2 minute walk test: 253 ft without AD  GAIT: Distance walked: 253 ft Assistive device utilized: Walker - 2 wheeled but did 2 MWT without AD Level of assistance: Modified independence Comments: noted left foot/hip internal rotation; decreased gait speed.     TODAY'S TREATMENT:                                                                                                                              DATE: 12/08/22 physical therapy evaluation and HEP instruction    PATIENT  EDUCATION:  Education details: Patient educated on exam findings, POC, scope of PT, HEP, and what to expect next treatment. Person educated: Patient Education method: Explanation, Demonstration, and Handouts Education comprehension: verbalized understanding, returned demonstration, verbal cues required, and tactile cues required  HOME EXERCISE PROGRAM: Access Code: [email or web address] URL: [email or web address] Date: 12/08/2022 Prepared by: [facility] - [facility]  Exercises - Seated Heel Toe Raises  - 2 x daily - 7 x weekly - 2 sets - 10 reps - Seated Long Arc Quad  - 2 x daily - 7 x weekly - 2 sets - 10 reps - Seated March  - 2 x daily - 7 x weekly - 2 sets - 10 reps - Supine Heel Slide  - 2 x daily - 7 x weekly - 2 sets - 10 reps - Supine Quad Set  - 2 x daily - 7 x weekly - 2 sets - 10 reps - 5 sec hold  ASSESSMENT:  CLINICAL IMPRESSION: Patient is a 67 y.o. female who was seen today for physical therapy evaluation and treatment for s/p rt ant THR on 12/02/2022 (start therapy on 12/04/2022). Patient  presents to physical therapy with complaint of right hip pain and weakness after Right THR. Patient demonstrates muscle weakness, reduced ROM, and fascial restrictions which are likely contributing to symptoms of pain and are negatively impacting patient ability to perform ADLs and functional mobility tasks. Patient will benefit from skilled physical therapy services to address these deficits to reduce pain and improve level of function with ADLs and functional mobility tasks.   OBJECTIVE IMPAIRMENTS: Abnormal gait, decreased activity tolerance, decreased balance, decreased endurance, decreased mobility, difficulty walking, decreased ROM, decreased strength, hypomobility, increased edema, increased fascial restrictions, impaired perceived functional ability, increased muscle spasms, and pain.   ACTIVITY LIMITATIONS: carrying, lifting, bending, sitting, standing, squatting, sleeping, stairs,  transfers, bed mobility, locomotion level, and caring for others  PARTICIPATION LIMITATIONS: meal prep, cleaning, laundry, driving, shopping, community activity, and yard work   [facility] POTENTIAL: Good  CLINICAL DECISION MAKING: Stable/uncomplicated  EVALUATION COMPLEXITY: Low   GOALS: Goals reviewed with  patient? No  SHORT TERM GOALS: Target date: 12/22/2022 patient will be independent with initial HEP   Baseline: Goal status: INITIAL  2.  Patient will self report 50% improvement to improve tolerance for functional activity  Baseline:  Goal status: INITIAL  LONG TERM GOALS: Target date: 12/29/2022  Patient will be independent in self management strategies to improve quality of life and functional outcomes.  Baseline:  Goal status: INITIAL  2.  Patient will self report 75% improvement to improve tolerance for functional activity  Baseline:  Goal status: INITIAL  3.  Patient will improve FOTO score to 60 to demonstrate improved functional mobilty  Baseline: 46 Goal status: INITIAL  4.  Patient will increase right  leg MMTs to 4+-5/5 without pain to promote return to ambulation community distances with minimal deviation.    Baseline: see above Goal status: INITIAL  5.  Patient will increase distance on to 400 ft  to demonstrate improved functional mobility walking household and community distances.   Baseline: 253 ft Goal status: INITIAL   PLAN:  PT FREQUENCY: 2x/week  PT DURATION: 3 weeks  PLANNED INTERVENTIONS: Therapeutic exercises, Therapeutic activity, Neuromuscular re-education, Balance training, Gait training, Patient/Family education, Joint manipulation, Joint mobilization, Stair training, Orthotic/Fit training, DME instructions, Aquatic Therapy, Dry Needling, Electrical stimulation, Spinal manipulation, Spinal mobilization, Cryotherapy, Moist heat, Compression bandaging, scar mobilization, Splintting, Taping, Traction, Ultrasound, Ionotophoresis  /ml Dexamethasone, and Manual therapy   PLAN FOR NEXT SESSION: Review HEP and goals; progress right hip strengthening as able; balance, gait   10:32 AM, 12/08/22 Afra Tricarico Small Jaye Polidori MPT [place] [facility] [place] [phone] [phone]

## 2022-12-10 ENCOUNTER — Ambulatory Visit (HOSPITAL_COMMUNITY): Payer: Medicare Other | Admitting: Physical Therapy

## 2022-12-10 DIAGNOSIS — M25551 Pain in right hip: Secondary | ICD-10-CM

## 2022-12-10 DIAGNOSIS — R262 Difficulty in walking, not elsewhere classified: Secondary | ICD-10-CM

## 2022-12-10 NOTE — Therapy (Signed)
OUTPATIENT PHYSICAL THERAPY LOWER EXTREMITY EVALUATION   Patient Name: Amy Cook MRN: [id number] DOB:June 14, 1956, 67 y.o., female Today's Date: 12/10/2022  END OF SESSION:  PT End of Session - 12/10/22 1038     Visit Number 2    Number of Visits 6    Date for PT Re-Evaluation 12/29/22    Authorization Type Medicare part A and [employer]    Progress Note Due on Visit 10    PT Start Time 1036    PT Stop Time 1114    PT Time Calculation (min) 38 min    Activity Tolerance Patient tolerated treatment well    Behavior During Therapy [facility] for tasks assessed/performed             Past Medical History:  Diagnosis Date   Arthritis    Cataract 12/2016   both   Hypertension    Past Surgical History:  Procedure Laterality Date   ABDOMINAL HYSTERECTOMY  2001   complete   CESAREAN SECTION     FLEXIBLE SIGMOIDOSCOPY N/A 08/21/2021   Procedure: FLEXIBLE SIGMOIDOSCOPY;  Surgeon: Malissa Hippo, MD;  [facility];  Service: Endoscopy;  Laterality: N/A;   Patient Active Problem List   Diagnosis Date Noted   Hypertension 04/12/2017    PCP: Alvina Filbert, MD  REFERRING PROVIDER: Jodi Geralds, MD  REFERRING DIAG: s/p rt ant THR on 12/02/2022 (start therapy on 12/04/2022)  THERAPY DIAG:  Pain in right hip  Difficulty in walking, not elsewhere classified  Rationale for Evaluation and Treatment: Rehabilitation  ONSET DATE: 12/02/22  SUBJECTIVE:   SUBJECTIVE STATEMENT: Patient states she is doing good today. She is taking an antibiotic now due to adhesive allergy after bandage removal on Tuesday. Compliant with HEP without issues.    Eval: S/p 4/10 right THA; she did have a slide down into the floor dehydration; taking Tylenol only now; going to MD office today to have bandage taken off  PERTINENT HISTORY: Left THA Jan 2023 Allergic to [place] PAIN:  Are you having pain? Yes: NPRS scale: 3/10 Pain location: right anterior hip Aggravating  factors: none noted Relieving factors: Tylenol  PRECAUTIONS: None  WEIGHT BEARING RESTRICTIONS: No  FALLS:  Has patient fallen in last 6 months? Yes. Number of falls 1  LIVING ENVIRONMENT: Lives with: lives with their spouse Lives in: House/apartment Stairs: Yes: External: 4 steps; none Has following equipment at home: Single point cane and Walker - 2 wheeled  OCCUPATION: not working; retired  PLOF: Independent  PATIENT GOALS: to walk without pain; be able to hike  NEXT MD VISIT: today to have bandage taken off  OBJECTIVE:   DIAGNOSTIC FINDINGS: none  PATIENT SURVEYS:  FOTO 46  COGNITION: Overall cognitive status: Within functional limits for tasks assessed     SENSATION: WFL  EDEMA:  Yes normal for this time s/p right hip and right leg  PALPATION: Tender at and around incision  LOWER EXTREMITY ROM:  Active ROM Right eval Left eval  Hip flexion 62   Hip extension    Hip abduction    Hip adduction    Hip internal rotation    Hip external rotation    Knee flexion 135   Knee extension    Ankle dorsiflexion    Ankle plantarflexion    Ankle inversion    Ankle eversion     (Blank rows = not tested)  LOWER EXTREMITY MMT:  MMT Right eval Left eval  Hip flexion 3+   Hip  extension    Hip abduction    Hip adduction    Hip internal rotation    Hip external rotation    Knee flexion    Knee extension 4   Ankle dorsiflexion 4+   Ankle plantarflexion    Ankle inversion    Ankle eversion     (Blank rows = not tested)  FUNCTIONAL TESTS:  5 times sit to stand: 14.55 sec 2 minute walk test: 253 ft without AD  GAIT: Distance walked: 253 ft Assistive device utilized: Walker - 2 wheeled but did 2 MWT without AD Level of assistance: Modified independence Comments: noted left foot/hip internal rotation; decreased gait speed.     TODAY'S TREATMENT:                                                                                                                               DATE:  12/10/22 Supine:  Quad set 10 x 5"  Glute set 10 x 5" Heel slide x15  Seated LAQ 10 x 5"  Standing: Heel raise 2 x10 Toe raise 2 x10  Sit to stand from elevated mat 2 x 10  Standing hip abduction 2 x 10   12/08/22 physical therapy evaluation and HEP instruction    PATIENT EDUCATION:  Education details: Patient educated on exam findings, POC, scope of PT, HEP, and what to expect next treatment. Person educated: Patient Education method: Explanation, Demonstration, and Handouts Education comprehension: verbalized understanding, returned demonstration, verbal cues required, and tactile cues required  HOME EXERCISE PROGRAM: Access Code: [email or web address] URL: [email or web address] 12/10/22 - Sit to Stand with Arms Crossed  - 2 x daily - 7 x weekly - 2 sets - 10 reps - Heel Raises with Counter Support  - 2 x daily - 7 x weekly - 2 sets - 10 reps - Toe Raises with Counter Support  - 2 x daily - 7 x weekly - 2 sets - 10 reps - Standing Hip Abduction with Counter Support  - 2 x daily - 7 x weekly - 2 sets - 10 reps  Date: 12/08/2022 Prepared by: [facility] - [facility]  Exercises - Seated Heel Toe Raises  - 2 x daily - 7 x weekly - 2 sets - 10 reps - Seated Long Arc Quad  - 2 x daily - 7 x weekly - 2 sets - 10 reps - Seated March  - 2 x daily - 7 x weekly - 2 sets - 10 reps - Supine Heel Slide  - 2 x daily - 7 x weekly - 2 sets - 10 reps - Supine Quad Set  - 2 x daily - 7 x weekly - 2 sets - 10 reps - 5 sec hold  ASSESSMENT:  CLINICAL IMPRESSION: Patient tolerated session well today with no increased complaint of pain. Good return with all there ex with minimal cueing. Patient educated on purpose and function of all added exercises. Updated HEP and issued handout. Patient  will continue to benefit from skilled therapy services to reduce remaining deficits and improve functional ability.    OBJECTIVE IMPAIRMENTS: Abnormal gait, decreased activity tolerance,  decreased balance, decreased endurance, decreased mobility, difficulty walking, decreased ROM, decreased strength, hypomobility, increased edema, increased fascial restrictions, impaired perceived functional ability, increased muscle spasms, and pain.   ACTIVITY LIMITATIONS: carrying, lifting, bending, sitting, standing, squatting, sleeping, stairs, transfers, bed mobility, locomotion level, and caring for others  PARTICIPATION LIMITATIONS: meal prep, cleaning, laundry, driving, shopping, community activity, and yard work   [facility] POTENTIAL: Good  CLINICAL DECISION MAKING: Stable/uncomplicated  EVALUATION COMPLEXITY: Low   GOALS: Goals reviewed with patient? No  SHORT TERM GOALS: Target date: 12/22/2022 patient will be independent with initial HEP  Baseline: Goal status: INITIAL  2.  Patient will self report 50% improvement to improve tolerance for functional activity  Baseline:  Goal status: INITIAL  LONG TERM GOALS: Target date: 12/29/2022  Patient will be independent in self management strategies to improve quality of life and functional outcomes.  Baseline:  Goal status: INITIAL  2.  Patient will self report 75% improvement to improve tolerance for functional activity  Baseline:  Goal status: INITIAL  3.  Patient will improve FOTO score to 60 to demonstrate improved functional mobilty  Baseline: 46 Goal status: INITIAL  4.  Patient will increase right leg MMTs to 4+-5/5 without pain to promote return to ambulation community distances with minimal deviation.   Baseline: see above Goal status: INITIAL  5.  Patient will increase distance on to 400 ft  to demonstrate improved functional mobility walking household and community distances.   Baseline: 253 ft Goal status: INITIAL   PLAN:  PT FREQUENCY: 2x/week  PT DURATION: 3 weeks  PLANNED INTERVENTIONS: Therapeutic exercises, Therapeutic activity, Neuromuscular re-education, Balance training, Gait  training, Patient/Family education, Joint manipulation, Joint mobilization, Stair training, Orthotic/Fit training, DME instructions, Aquatic Therapy, Dry Needling, Electrical stimulation, Spinal manipulation, Spinal mobilization, Cryotherapy, Moist heat, Compression bandaging, scar mobilization, Splintting, Taping, Traction, Ultrasound, Ionotophoresis /ml Dexamethasone, and Manual therapy   PLAN FOR NEXT SESSION: progress right hip strengthening as able; balance, gait  11:13 AM, 12/10/22 Georges Lynch PT DPT  Physical Therapist with [place]  [facility]  [phone]

## 2022-12-15 ENCOUNTER — Encounter (HOSPITAL_COMMUNITY): Payer: PRIVATE HEALTH INSURANCE | Admitting: Physical Therapy

## 2022-12-17 ENCOUNTER — Ambulatory Visit (HOSPITAL_COMMUNITY): Payer: Medicare Other | Admitting: Physical Therapy

## 2022-12-17 DIAGNOSIS — R262 Difficulty in walking, not elsewhere classified: Secondary | ICD-10-CM

## 2022-12-17 DIAGNOSIS — M25551 Pain in right hip: Secondary | ICD-10-CM | POA: Diagnosis not present

## 2022-12-17 NOTE — Therapy (Addendum)
OUTPATIENT PHYSICAL THERAPY TREATMENT PHYSICAL THERAPY DISCHARGE SUMMARY  Visits from Start of Care: 3  Current functional level related to goals / functional outcomes: See below   Remaining deficits: See below   Education / Equipment: See below   Patient agrees to discharge. Patient goals were met. Patient is being discharged due to meeting the stated rehab goals.    Patient Name: Amy Cook MRN: [id number] DOB:[birth year], 67 y.o., female Today's Date: 12/17/2022  END OF SESSION:  PT End of Session - 12/17/22 0945     Visit Number 3    Number of Visits 6    Date for PT Re-Evaluation 12/29/22    Authorization Type Medicare part A and [employer]    Progress Note Due on Visit 10    PT Start Time [phone]    PT Stop Time 1012    PT Time Calculation (min) 25 min    Activity Tolerance Patient tolerated treatment well    Behavior During Therapy [facility] for tasks assessed/performed             Past Medical History:  Diagnosis Date   Arthritis    Cataract 12/2016   both   Hypertension    Past Surgical History:  Procedure Laterality Date   ABDOMINAL HYSTERECTOMY  2001   complete   CESAREAN SECTION     FLEXIBLE SIGMOIDOSCOPY N/A 08/21/2021   Procedure: FLEXIBLE SIGMOIDOSCOPY;  Surgeon: Malissa Hippo, MD;  [facility];  Service: Endoscopy;  Laterality: N/A;   Patient Active Problem List   Diagnosis Date Noted   Hypertension 04/12/2017    PCP: Alvina Filbert, MD  REFERRING PROVIDER: Jodi Geralds, MD  REFERRING DIAG: s/p rt ant THR on 12/02/2022 (start therapy on 12/04/2022)  THERAPY DIAG:  Difficulty in walking, not elsewhere classified  Pain in right hip  Rationale for Evaluation and Treatment: Rehabilitation  ONSET DATE: 12/02/22  SUBJECTIVE:   SUBJECTIVE STATEMENT: Patient states she is doing good today. No pain or issues.  Feel 100%. Goes back to MD on Tuesday.   Eval: S/p 4/10 right THA; she did have a slide down into the  floor dehydration; taking Tylenol only now; going to MD office today to have bandage taken off  PERTINENT HISTORY: Right THA December 02, 2022 Left THA Jan 2023 Allergic to [place] PAIN:  Are you having pain? No  PRECAUTIONS: None  WEIGHT BEARING RESTRICTIONS: No  FALLS:  Has patient fallen in last 6 months? Yes. Number of falls 1  LIVING ENVIRONMENT: Lives with: lives with their spouse Lives in: House/apartment Stairs: Yes: External: 4 steps; none Has following equipment at home: Single point cane and Walker - 2 wheeled  OCCUPATION: not working; retired  PLOF: Independent  PATIENT GOALS: to walk without pain; be able to hike  NEXT MD VISIT: today to have bandage taken off  OBJECTIVE:   DIAGNOSTIC FINDINGS: none  PATIENT SURVEYS:  FOTO 46  COGNITION: Overall cognitive status: Within functional limits for tasks assessed     SENSATION: WFL  EDEMA:  Yes normal for this time s/p right hip and right leg  PALPATION: Tender at and around incision  LOWER EXTREMITY ROM:  Active ROM Right eval Left eval  Hip flexion 62   Hip extension    Hip abduction    Hip adduction    Hip internal rotation    Hip external rotation    Knee flexion 135   Knee extension    Ankle dorsiflexion  Ankle plantarflexion    Ankle inversion    Ankle eversion     (Blank rows = not tested)  LOWER EXTREMITY MMT:  MMT Right eval Left eval Right 12/17/22  Hip flexion 3+  4+  Hip extension     Hip abduction     Hip adduction     Hip internal rotation     Hip external rotation     Knee flexion     Knee extension 4  5  Ankle dorsiflexion 4+  5  Ankle plantarflexion     Ankle inversion     Ankle eversion      (Blank rows = not tested)  FUNCTIONAL TESTS:  5 times sit to stand: 14.55 sec 2 minute walk test: 253 ft without AD  GAIT: Distance walked: 253 ft Assistive device utilized: Walker - 2 wheeled but did 2 MWT without AD Level of assistance: Modified  independence Comments: noted left foot/hip internal rotation; decreased gait speed.     TODAY'S TREATMENT:                                                                                                                              DATE:  12/17/22 Heelraise 20X Standing hip abduction 2X10 7" stair negotiation reciprocally without HR independently 460 feet (was 253) MMT see above FOTO 78% (goal was 60%)  12/10/22 Supine:  Quad set 10 x 5"  Glute set 10 x 5" Heel slide x15  Seated LAQ 10 x 5"  Standing: Heel raise 2 x10 Toe raise 2 x10  Sit to stand from elevated mat 2 x 10  Standing hip abduction 2 x 10   12/08/22 physical therapy evaluation and HEP instruction    PATIENT EDUCATION:  Education details: Patient educated on exam findings, POC, scope of PT, HEP, and what to expect next treatment. Person educated: Patient Education method: Explanation, Demonstration, and Handouts Education comprehension: verbalized understanding, returned demonstration, verbal cues required, and tactile cues required  HOME EXERCISE PROGRAM: Access Code: [email or web address] URL: [email or web address] 12/10/22 - Sit to Stand with Arms Crossed  - 2 x daily - 7 x weekly - 2 sets - 10 reps - Heel Raises with Counter Support  - 2 x daily - 7 x weekly - 2 sets - 10 reps - Toe Raises with Counter Support  - 2 x daily - 7 x weekly - 2 sets - 10 reps - Standing Hip Abduction with Counter Support  - 2 x daily - 7 x weekly - 2 sets - 10 reps  Date: 12/08/2022 Prepared by: [facility] - [facility]  Exercises - Seated Heel Toe Raises  - 2 x daily - 7 x weekly - 2 sets - 10 reps - Seated Long Arc Quad  - 2 x daily - 7 x weekly - 2 sets - 10 reps - Seated March  - 2 x daily - 7 x weekly - 2 sets - 10 reps - Supine Heel  Slide  - 2 x daily - 7 x weekly - 2 sets - 10 reps - Supine Quad Set  - 2 x daily - 7 x weekly - 2 sets - 10 reps - 5 sec hold  ASSESSMENT:  CLINICAL IMPRESSION: Pt wishing to be done with  therapy at this point.  Pt is "100% improved", completing all ADL's/tasks/recreational activities without difficulty and having no pain.  Functional tests and FOTO reveal all goals met at this time.  Pt with no further questions, issues or concerns.    OBJECTIVE IMPAIRMENTS: Abnormal gait, decreased activity tolerance, decreased balance, decreased endurance, decreased mobility, difficulty walking, decreased ROM, decreased strength, hypomobility, increased edema, increased fascial restrictions, impaired perceived functional ability, increased muscle spasms, and pain.   ACTIVITY LIMITATIONS: carrying, lifting, bending, sitting, standing, squatting, sleeping, stairs, transfers, bed mobility, locomotion level, and caring for others  PARTICIPATION LIMITATIONS: meal prep, cleaning, laundry, driving, shopping, community activity, and yard work   [facility] POTENTIAL: Good  CLINICAL DECISION MAKING: Stable/uncomplicated  EVALUATION COMPLEXITY: Low   GOALS: Goals reviewed with patient? Yes  SHORT TERM GOALS: Target date: 12/22/2022 patient will be independent with initial HEP  Baseline: Goal status: MET  2.  Patient will self report 50% improvement to improve tolerance for functional activity  Baseline:  Goal status: MET  LONG TERM GOALS: Target date: 12/29/2022  Patient will be independent in self management strategies to improve quality of life and functional outcomes.  Baseline:  Goal status: MET  2.  Patient will self report 75% improvement to improve tolerance for functional activity  Baseline:  Goal status: MET  3.  Patient will improve FOTO score to 60 to demonstrate improved functional mobilty  Baseline: 46 Goal status: MET  4.  Patient will increase right leg MMTs to 4+-5/5 without pain to promote return to ambulation community distances with minimal deviation.   Baseline: see above Goal status: MET  5.  Patient will increase distance on to 400 ft  to demonstrate  improved functional mobility walking household and community distances.   Baseline: 253 ft Goal status: MET   PLAN:  PT FREQUENCY: 2x/week  PT DURATION: 3 weeks  PLANNED INTERVENTIONS: Therapeutic exercises, Therapeutic activity, Neuromuscular re-education, Balance training, Gait training, Patient/Family education, Joint manipulation, Joint mobilization, Stair training, Orthotic/Fit training, DME instructions, Aquatic Therapy, Dry Needling, Electrical stimulation, Spinal manipulation, Spinal mobilization, Cryotherapy, Moist heat, Compression bandaging, scar mobilization, Splintting, Taping, Traction, Ultrasound, Ionotophoresis 4mg /ml Dexamethasone, and Manual therapy   PLAN FOR NEXT SESSION: Discharge; all goals met.   10:26 AM, 12/17/22 Lurena Nida, PTA/CLT [facility] Ph: [phone]

## 2022-12-21 ENCOUNTER — Encounter (HOSPITAL_COMMUNITY): Payer: PRIVATE HEALTH INSURANCE | Admitting: Physical Therapy

## 2022-12-23 ENCOUNTER — Encounter (HOSPITAL_COMMUNITY): Payer: PRIVATE HEALTH INSURANCE | Admitting: Physical Therapy

## 2022-12-25 ENCOUNTER — Other Ambulatory Visit (HOSPITAL_BASED_OUTPATIENT_CLINIC_OR_DEPARTMENT_OTHER): Payer: Self-pay | Admitting: Orthopaedic Surgery

## 2022-12-25 ENCOUNTER — Ambulatory Visit (INDEPENDENT_AMBULATORY_CARE_PROVIDER_SITE_OTHER): Payer: Medicare Other

## 2022-12-25 ENCOUNTER — Encounter (HOSPITAL_BASED_OUTPATIENT_CLINIC_OR_DEPARTMENT_OTHER): Payer: Self-pay

## 2022-12-25 DIAGNOSIS — M79604 Pain in right leg: Secondary | ICD-10-CM | POA: Diagnosis not present

## 2022-12-29 ENCOUNTER — Encounter (HOSPITAL_COMMUNITY): Payer: PRIVATE HEALTH INSURANCE | Admitting: Physical Therapy

## 2022-12-31 ENCOUNTER — Encounter (HOSPITAL_COMMUNITY): Payer: PRIVATE HEALTH INSURANCE | Admitting: Physical Therapy

## 2023-01-05 ENCOUNTER — Encounter (HOSPITAL_COMMUNITY): Payer: PRIVATE HEALTH INSURANCE | Admitting: Physical Therapy

## 2023-01-07 ENCOUNTER — Encounter (HOSPITAL_COMMUNITY): Payer: PRIVATE HEALTH INSURANCE | Admitting: Physical Therapy

## 2023-04-30 NOTE — Therapy (Signed)
OUTPATIENT PHYSICAL THERAPY LOWER EXTREMITY EVALUATION   Patient Name: Amy Cook MRN: [id number] DOB:May 14, 1956, 67 y.o., female Today's Date: 05/03/2023  END OF SESSION:  PT End of Session - 05/03/23 0940     Visit Number 1    Number of Visits 8    Date for PT Re-Evaluation 05/31/23    Authorization Type Medicare part A and [employer]    Progress Note Due on Visit 10    PT Start Time [phone]    PT Stop Time 1020    PT Time Calculation (min) 39 min    Activity Tolerance Patient tolerated treatment well    Behavior During Therapy [facility] for tasks assessed/performed             Past Medical History:  Diagnosis Date   Arthritis    Cataract 12/2016   both   Hypertension    Past Surgical History:  Procedure Laterality Date   ABDOMINAL HYSTERECTOMY  2001   complete   CESAREAN SECTION     FLEXIBLE SIGMOIDOSCOPY N/A 08/21/2021   Procedure: FLEXIBLE SIGMOIDOSCOPY;  Surgeon: Malissa Hippo, MD;  Location: [facility];  Service: Endoscopy;  Laterality: N/A;   Patient Active Problem List   Diagnosis Date Noted   Hypertension 04/12/2017    PCP: Lenetta Quaker, MD  REFERRING PROVIDER: Jodi Geralds, MD  REFERRING DIAG: M70.61 (ICD-10-CM) - Greater trochanteric bursitis of right hip  THERAPY DIAG:  Difficulty in walking, not elsewhere classified - Plan: PT plan of care cert/re-cert  Pain in right hip - Plan: PT plan of care cert/re-cert  Rationale for Evaluation and Treatment: Rehabilitation  ONSET DATE: 12/02/22 s/p right THR  SUBJECTIVE:   SUBJECTIVE STATEMENT: s/p rt ant THR on 12/02/2022; since has had some lingering pain.  Had a cortisone shot 2 weeks ago that helped a little bit.  some "zinging" pain when driving and turning to the left; cannot sleep on right side.  Feels she can do stuff but just hurts.   PERTINENT HISTORY: s/p rt ant THR on 12/02/2022    PAIN:  Are you having pain? Yes: NPRS scale: 2/10 Pain location: right hip Pain  description: quick sharp occasionally with twisting Aggravating factors: twisting her trunk; sleeping on right side Relieving factors: tylenol;motrin  PRECAUTIONS: None     WEIGHT BEARING RESTRICTIONS: No  FALLS:  Has patient fallen in last 6 months? Yes. Number of falls 1  OCCUPATION: not working  PLOF: Independent  PATIENT GOALS: no hip pain  NEXT MD VISIT: in 2 weeks  OBJECTIVE:   DIAGNOSTIC FINDINGS: none in epic but per patient had x-ray at Dr. Luiz Blare unremarkable ex  PATIENT SURVEYS:  FOTO 77  COGNITION: Overall cognitive status: Within functional limits for tasks assessed     SENSATION: WFL  EDEMA:  None noted  POSTURE: rounded shoulders and flexed trunk   PALPATION: Tender right side greater troch and around  LUMBAR ROM:   Active  A/PROM  eval  Flexion 80% available  Extension 30% available  Right lateral flexion To 1" past right knee joint line  Left lateral flexion To knee joint line  Right rotation   Left rotation    (Blank rows = not tested)  LOWER EXTREMITY ROM: noted bilateral hip internal rotation in standing "pigeon toed"  Active ROM Right eval Left eval  Hip flexion    Hip extension    Hip abduction    Hip adduction    Hip internal rotation    Hip  external rotation limited Limited > right  Knee flexion    Knee extension    Ankle dorsiflexion    Ankle plantarflexion    Ankle inversion    Ankle eversion     (Blank rows = not tested)  LOWER EXTREMITY MMT:  MMT Right eval Left eval  Hip flexion 4+ 5  Hip extension 4+ 4+  Hip abduction    Hip adduction    Hip internal rotation 4 4  Hip external rotation 3+ 3+  Knee flexion 4+ 4+  Knee extension 5 5  Ankle dorsiflexion 4+ 5  Ankle plantarflexion    Ankle inversion    Ankle eversion     (Blank rows = not tested)  FUNCTIONAL TESTS:  5 times sit to stand: 10.97 sec no UE assist noted mild right knee valgus with sit to stand 2 minute walk test: 431  ft  GAIT: Distance walked: 431 ft Assistive device utilized: None Level of assistance: Modified independence Comments: noted bilateral hip internal rotation   TODAY'S TREATMENT:                                                                                                                              DATE: 05/03/23 physical therapy evaluation and HEP    PATIENT EDUCATION: Education details: Patient educated on exam findings, POC, scope of PT, HEP, and what to expect next visit. Person educated: Patient Education method: Explanation, Demonstration, and Handouts Education comprehension: verbalized understanding, returned demonstration, verbal cues required, and tactile cues required  HOME EXERCISE PROGRAM: Access Code: [email or web address] URL: [email or web address] Date: 05/03/2023 Prepared by: [facility] - [facility]  Exercises - Supine Figure 4 Piriformis Stretch  - 2 x daily - 7 x weekly - 1 sets - 5 reps - 20 sec hold - Standing Lumbar Extension with Counter  - 1 x daily - 7 x weekly - 1 sets - 10 reps - 2 sec hold - Supine Piriformis Stretch with Towel  - 1 x daily - 7 x weekly - 1 sets - 5 reps - 20 sec hold  ASSESSMENT:  CLINICAL IMPRESSION: Patient is a 67 y.o. female who was seen today for physical therapy evaluation and treatment for M70.61 (ICD-10-CM) - Greater trochanteric bursitis of right hip.Patient demonstrates muscle weakness, reduced ROM, and fascial restrictions which are likely contributing to symptoms of pain and are negatively impacting patient ability to perform ADLs and functional mobility tasks. Patient will benefit from skilled physical therapy services to address these deficits to reduce pain and improve level of function with ADLs and functional mobility tasks.   OBJECTIVE IMPAIRMENTS: Abnormal gait, decreased mobility, difficulty walking, decreased ROM, decreased strength, hypomobility, increased fascial restrictions, impaired perceived functional ability, and pain.    ACTIVITY LIMITATIONS: bending, sitting, standing, sleeping, and locomotion level  PARTICIPATION LIMITATIONS: meal prep, cleaning, driving, shopping, and yard work  [facility] POTENTIAL: Good  CLINICAL DECISION MAKING: Stable/uncomplicated  EVALUATION COMPLEXITY: Low   GOALS: Goals reviewed with patient?  No  SHORT TERM GOALS: Target date: 05/17/2023 patient will be independent with initial HEP  Baseline: Goal status: INITIAL  2.  Patient will self report 30% improvement to improve tolerance for functional activity  Baseline:  Goal status: INITIAL  LONG TERM GOALS: Target date: 05/31/2023  Patient will be independent in self management strategies to improve quality of life and functional outcomes.  Baseline:  Goal status: INITIAL  2.  Patient will self report 50% improvement to improve tolerance for functional activity  Baseline:  Goal status: INITIAL  3.  Patient will increase  leg MMTs to 5/5 without pain to promote return to ambulation community distances with minimal deviation.  Baseline: see above Goal status: INITIAL  4.  Patient will increase distance on to 450 ft  to demonstrate improved functional mobility walking household and community distances.   Baseline: 431 Goal status: INITIAL  5.  Patient will be able to turn in the car to the left to look for oncoming traffic without right hip pain Baseline:  Goal status: INITIAL  PLAN:  PT FREQUENCY: 2x/week  PT DURATION: 4 weeks  PLANNED INTERVENTIONS: Therapeutic exercises, Therapeutic activity, Neuromuscular re-education, Balance training, Gait training, Patient/Family education, Joint manipulation, Joint mobilization, Stair training, Orthotic/Fit training, DME instructions, Aquatic Therapy, Dry Needling, Electrical stimulation, Spinal manipulation, Spinal mobilization, Cryotherapy, Moist heat, Compression bandaging, scar mobilization, Splintting, Taping, Traction, Ultrasound, Ionotophoresis 4mg /ml  Dexamethasone, and Manual therapy   PLAN FOR NEXT SESSION: Review HEP and goals; right hip mobility and strength; question possible lumbar component  10:38 AM, 05/03/23 Shelvia Fojtik Small Amanuel Sinkfield MPT [facility] [place] [phone] Ph:[phone]

## 2023-05-03 ENCOUNTER — Ambulatory Visit (HOSPITAL_COMMUNITY): Payer: Medicare Other | Attending: Orthopedic Surgery

## 2023-05-03 ENCOUNTER — Other Ambulatory Visit: Payer: Self-pay

## 2023-05-03 DIAGNOSIS — M25552 Pain in left hip: Secondary | ICD-10-CM | POA: Diagnosis present

## 2023-05-03 DIAGNOSIS — M25551 Pain in right hip: Secondary | ICD-10-CM | POA: Diagnosis present

## 2023-05-03 DIAGNOSIS — R2689 Other abnormalities of gait and mobility: Secondary | ICD-10-CM | POA: Diagnosis present

## 2023-05-03 DIAGNOSIS — R262 Difficulty in walking, not elsewhere classified: Secondary | ICD-10-CM | POA: Insufficient documentation

## 2023-05-11 ENCOUNTER — Encounter (HOSPITAL_COMMUNITY): Payer: Self-pay

## 2023-05-11 ENCOUNTER — Ambulatory Visit (HOSPITAL_COMMUNITY): Payer: Medicare Other

## 2023-05-11 DIAGNOSIS — R262 Difficulty in walking, not elsewhere classified: Secondary | ICD-10-CM

## 2023-05-11 DIAGNOSIS — R2689 Other abnormalities of gait and mobility: Secondary | ICD-10-CM

## 2023-05-11 DIAGNOSIS — M25551 Pain in right hip: Secondary | ICD-10-CM

## 2023-05-11 DIAGNOSIS — M25552 Pain in left hip: Secondary | ICD-10-CM

## 2023-05-11 NOTE — Therapy (Signed)
OUTPATIENT PHYSICAL THERAPY LOWER EXTREMITY EVALUATION   Patient Name: Amy Cook MRN: [id number] DOB:13-Jul-1956, 67 y.o., female Today's Date: 05/11/2023  END OF SESSION:  PT End of Session - 05/11/23 1530     Visit Number 2    Number of Visits 8    Date for PT Re-Evaluation 05/31/23    Authorization Type Medicare part A and [employer]    Progress Note Due on Visit 10    PT Start Time 1530    PT Stop Time 1614    PT Time Calculation (min) 44 min    Activity Tolerance Patient tolerated treatment well    Behavior During Therapy [facility] for tasks assessed/performed             Past Medical History:  Diagnosis Date   Arthritis    Cataract 12/2016   both   Hypertension    Past Surgical History:  Procedure Laterality Date   ABDOMINAL HYSTERECTOMY  2001   complete   CESAREAN SECTION     FLEXIBLE SIGMOIDOSCOPY N/A 08/21/2021   Procedure: FLEXIBLE SIGMOIDOSCOPY;  Surgeon: Malissa Hippo, MD;  [facility]: [facility];  Service: Endoscopy;  Laterality: N/A;   Patient Active Problem List   Diagnosis Date Noted   Hypertension 04/12/2017    PCP: Lenetta Quaker, MD  REFERRING PROVIDER: Jodi Geralds, MD Next apt 06/01/23  REFERRING DIAG: M70.61 (ICD-10-CM) - Greater trochanteric bursitis of right hip  THERAPY DIAG:  Difficulty in walking, not elsewhere classified  Pain in right hip  Pain in left hip  Other abnormalities of gait and mobility  Rationale for Evaluation and Treatment: Rehabilitation  ONSET DATE: 12/02/22 s/p right THR  SUBJECTIVE:   SUBJECTIVE STATEMENT: 05/11/23:  Pt reports she feels a lot better since she has began the HEP.  Reports she has improved tolerance for sleeping on Rt side and some improvements when looking to the Left while driving.  Eval:  s/p rt ant THR on 12/02/2022; since has had some lingering pain.  Had a cortisone shot 2 weeks ago that helped a little bit.  some "zinging" pain when driving and turning to the left; cannot  sleep on right side.  Feels she can do stuff but just hurts.   PERTINENT HISTORY: s/p rt ant THR on 12/02/2022    PAIN:  Are you having pain? Yes: NPRS scale: 2/10 Pain location: right hip Pain description: quick sharp occasionally with twisting Aggravating factors: twisting her trunk; sleeping on right side Relieving factors: tylenol;motrin  PRECAUTIONS: None     WEIGHT BEARING RESTRICTIONS: No  FALLS:  Has patient fallen in last 6 months? Yes. Number of falls 1  OCCUPATION: not working  PLOF: Independent  PATIENT GOALS: no hip pain  NEXT MD VISIT: in 2 weeks  OBJECTIVE:   DIAGNOSTIC FINDINGS: none in epic but per patient had x-ray at Dr. Luiz Blare unremarkable ex  PATIENT SURVEYS:  FOTO 77  COGNITION: Overall cognitive status: Within functional limits for tasks assessed     SENSATION: WFL  EDEMA:  None noted  POSTURE: rounded shoulders and flexed trunk   PALPATION: Tender right side greater troch and around  LUMBAR ROM:   Active  A/PROM  eval  Flexion 80% available  Extension 30% available  Right lateral flexion To 1" past right knee joint line  Left lateral flexion To knee joint line  Right rotation   Left rotation    (Blank rows = not tested)  LOWER EXTREMITY ROM: noted bilateral hip internal  rotation in standing "pigeon toed"  Active ROM Right eval Left eval  Hip flexion    Hip extension    Hip abduction    Hip adduction    Hip internal rotation    Hip external rotation limited Limited > right  Knee flexion    Knee extension    Ankle dorsiflexion    Ankle plantarflexion    Ankle inversion    Ankle eversion     (Blank rows = not tested)  LOWER EXTREMITY MMT:  MMT Right eval Left eval  Hip flexion 4+ 5  Hip extension 4+ 4+  Hip abduction    Hip adduction    Hip internal rotation 4 4  Hip external rotation 3+ 3+  Knee flexion 4+ 4+  Knee extension 5 5  Ankle dorsiflexion 4+ 5  Ankle plantarflexion    Ankle inversion     Ankle eversion     (Blank rows = not tested)  FUNCTIONAL TESTS:  5 times sit to stand: 10.97 sec no UE assist noted mild right knee valgus with sit to stand 2 minute walk test: 431 ft  GAIT: Distance walked: 431 ft Assistive device utilized: None Level of assistance: Modified independence Comments: noted bilateral hip internal rotation   TODAY'S TREATMENT:                                                                                                                              DATE:  05/11/23: Reviewed goals Educated importance of HEP compliance for maximal benefits  Seated: STS 10x  Standing: 3D hip excursion 10 (squat, lumbar extension, lateral flexion with UE overhead, rotate) Supine:  Bridge with RTB around thigh  Piriformis stretch with towel assistance 3x 30"  Sidelying: clam with RTB 10x 5"   05/03/23 physical therapy evaluation and HEP    PATIENT EDUCATION: Education details: Patient educated on exam findings, POC, scope of PT, HEP, and what to expect next visit. Person educated: Patient Education method: Explanation, Demonstration, and Handouts Education comprehension: verbalized understanding, returned demonstration, verbal cues required, and tactile cues required  HOME EXERCISE PROGRAM: Access Code: [email or web address] URL: [email or web address] Date: 05/03/2023 Prepared by: [facility] - Rehab  Exercises - Supine Figure 4 Piriformis Stretch  - 2 x daily - 7 x weekly - 1 sets - 5 reps - 20 sec hold - Standing Lumbar Extension with Counter  - 1 x daily - 7 x weekly - 1 sets - 10 reps - 2 sec hold - Supine Piriformis Stretch with Towel  - 1 x daily - 7 x weekly - 1 sets - 5 reps - 20 sec hold  ASSESSMENT:  CLINICAL IMPRESSION:  05/11/23:  Reviewed goals and educated importance of HEP compliance.  PT able to recall current exercise program and demonstrate appropriate mechanics.  Session focus with hip mobility and strengthening with additional exercises added.   Pt encouraged to increase hold time with stretches for maximal lengthening of musculature.  No reports  of pain through session.    Eval: Patient is a 67 y.o. female who was seen today for physical therapy evaluation and treatment for M70.61 (ICD-10-CM) - Greater trochanteric bursitis of right hip.Patient demonstrates muscle weakness, reduced ROM, and fascial restrictions which are likely contributing to symptoms of pain and are negatively impacting patient ability to perform ADLs and functional mobility tasks. Patient will benefit from skilled physical therapy services to address these deficits to reduce pain and improve level of function with ADLs and functional mobility tasks.   OBJECTIVE IMPAIRMENTS: Abnormal gait, decreased mobility, difficulty walking, decreased ROM, decreased strength, hypomobility, increased fascial restrictions, impaired perceived functional ability, and pain.   ACTIVITY LIMITATIONS: bending, sitting, standing, sleeping, and locomotion level  PARTICIPATION LIMITATIONS: meal prep, cleaning, driving, shopping, and yard work  [facility] POTENTIAL: Good  CLINICAL DECISION MAKING: Stable/uncomplicated  EVALUATION COMPLEXITY: Low   GOALS: Goals reviewed with patient? No  SHORT TERM GOALS: Target date: 05/17/2023 patient will be independent with initial HEP  Baseline: Goal status: IN PROGRESS  2.  Patient will self report 30% improvement to improve tolerance for functional activity  Baseline:  Goal status: IN PROGRESS  LONG TERM GOALS: Target date: 05/31/2023  Patient will be independent in self management strategies to improve quality of life and functional outcomes.  Baseline:  Goal status: IN PROGRESS  2.  Patient will self report 50% improvement to improve tolerance for functional activity  Baseline:  Goal status: IN PROGRESS  3.  Patient will increase  leg MMTs to 5/5 without pain to promote return to ambulation community distances with minimal  deviation.  Baseline: see above Goal status: IN PROGRESS  4.  Patient will increase distance on to 450 ft  to demonstrate improved functional mobility walking household and community distances.   Baseline: 431 Goal status: IN PROGRESS  5.  Patient will be able to turn in the car to the left to look for oncoming traffic without right hip pain Baseline:  Goal status: IN PROGRESS  PLAN:  PT FREQUENCY: 2x/week  PT DURATION: 4 weeks  PLANNED INTERVENTIONS: Therapeutic exercises, Therapeutic activity, Neuromuscular re-education, Balance training, Gait training, Patient/Family education, Joint manipulation, Joint mobilization, Stair training, Orthotic/Fit training, DME instructions, Aquatic Therapy, Dry Needling, Electrical stimulation, Spinal manipulation, Spinal mobilization, Cryotherapy, Moist heat, Compression bandaging, scar mobilization, Splintting, Taping, Traction, Ultrasound, Ionotophoresis 4mg /ml Dexamethasone, and Manual therapy   PLAN FOR NEXT SESSION:  right hip mobility and strength; question possible lumbar component  Becky Sax, LPTA/CLT; [facility] [phone]  Juel Burrow, PTA 05/11/2023, 4:25 PM  4:25 PM, 05/11/23

## 2023-05-13 ENCOUNTER — Encounter (HOSPITAL_COMMUNITY): Payer: PRIVATE HEALTH INSURANCE

## 2023-05-13 ENCOUNTER — Telehealth (HOSPITAL_COMMUNITY): Payer: Self-pay

## 2023-05-13 ENCOUNTER — Ambulatory Visit (HOSPITAL_COMMUNITY): Payer: Medicare Other

## 2023-05-13 ENCOUNTER — Encounter (HOSPITAL_COMMUNITY): Payer: Self-pay

## 2023-05-13 DIAGNOSIS — M25552 Pain in left hip: Secondary | ICD-10-CM

## 2023-05-13 DIAGNOSIS — R262 Difficulty in walking, not elsewhere classified: Secondary | ICD-10-CM | POA: Diagnosis not present

## 2023-05-13 DIAGNOSIS — M25551 Pain in right hip: Secondary | ICD-10-CM

## 2023-05-13 DIAGNOSIS — R2689 Other abnormalities of gait and mobility: Secondary | ICD-10-CM

## 2023-05-13 NOTE — Telephone Encounter (Signed)
No show, called and spoke to pt who stated she forgot apt. Therapist was able to find apt available later today.   Becky Sax, LPTA/CLT; [place] [phone]

## 2023-05-13 NOTE — Therapy (Signed)
OUTPATIENT PHYSICAL THERAPY LOWER EXTREMITY TREATMENT   Patient Name: Amy Cook MRN: [id number] DOB:April 21, 1956, 67 y.o., female Today's Date: 05/13/2023  END OF SESSION:  PT End of Session - 05/13/23 1526     Visit Number 3    Number of Visits 8    Date for PT Re-Evaluation 05/31/23    Authorization Type Medicare part A and [employer]    Progress Note Due on Visit 10    PT Start Time 1527    PT Stop Time 1610    PT Time Calculation (min) 43 min    Activity Tolerance Patient tolerated treatment well    Behavior During Therapy [facility] for tasks assessed/performed             Past Medical History:  Diagnosis Date   Arthritis    Cataract 12/2016   both   Hypertension    Past Surgical History:  Procedure Laterality Date   ABDOMINAL HYSTERECTOMY  2001   complete   CESAREAN SECTION     FLEXIBLE SIGMOIDOSCOPY N/A 08/21/2021   Procedure: FLEXIBLE SIGMOIDOSCOPY;  Surgeon: Malissa Hippo, MD;  Location: [facility];  Service: Endoscopy;  Laterality: N/A;   Patient Active Problem List   Diagnosis Date Noted   Hypertension 04/12/2017    PCP: Lenetta Quaker, MD  REFERRING PROVIDER: Jodi Geralds, MD Next apt 06/01/23  REFERRING DIAG: M70.61 (ICD-10-CM) - Greater trochanteric bursitis of right hip  THERAPY DIAG:  Difficulty in walking, not elsewhere classified  Pain in right hip  Pain in left hip  Other abnormalities of gait and mobility  Rationale for Evaluation and Treatment: Rehabilitation  ONSET DATE: 12/02/22 s/p right THR  SUBJECTIVE:   SUBJECTIVE STATEMENT: 05/13/23:  Pt forgot about apt earlier today, called and was able to find apt later in the day.  No reports of pain currently, was a little sore yesterday.  Eval:  s/p rt ant THR on 12/02/2022; since has had some lingering pain.  Had a cortisone shot 2 weeks ago that helped a little bit.  some "zinging" pain when driving and turning to the left; cannot sleep on right side.  Feels she can do  stuff but just hurts.   PERTINENT HISTORY: s/p rt ant THR on 12/02/2022    PAIN:  Are you having pain? Yes: NPRS scale: 2/10 Pain location: right hip Pain description: quick sharp occasionally with twisting Aggravating factors: twisting her trunk; sleeping on right side Relieving factors: tylenol;motrin  PRECAUTIONS: None     WEIGHT BEARING RESTRICTIONS: No  FALLS:  Has patient fallen in last 6 months? Yes. Number of falls 1  OCCUPATION: not working  PLOF: Independent  PATIENT GOALS: no hip pain  NEXT MD VISIT: in 2 weeks  OBJECTIVE:   DIAGNOSTIC FINDINGS: none in epic but per patient had x-ray at Dr. Luiz Blare unremarkable ex  PATIENT SURVEYS:  FOTO 77  COGNITION: Overall cognitive status: Within functional limits for tasks assessed     SENSATION: WFL  EDEMA:  None noted  POSTURE: rounded shoulders and flexed trunk   PALPATION: Tender right side greater troch and around  LUMBAR ROM:   Active  A/PROM  eval  Flexion 80% available  Extension 30% available  Right lateral flexion To 1" past right knee joint line  Left lateral flexion To knee joint line  Right rotation   Left rotation    (Blank rows = not tested)  LOWER EXTREMITY ROM: noted bilateral hip internal rotation in standing "pigeon toed"  Active ROM Right eval Left eval  Hip flexion    Hip extension    Hip abduction    Hip adduction    Hip internal rotation    Hip external rotation limited Limited > right  Knee flexion    Knee extension    Ankle dorsiflexion    Ankle plantarflexion    Ankle inversion    Ankle eversion     (Blank rows = not tested)  LOWER EXTREMITY MMT:  MMT Right eval Left eval  Hip flexion 4+ 5  Hip extension 4+ 4+  Hip abduction    Hip adduction    Hip internal rotation 4 4  Hip external rotation 3+ 3+  Knee flexion 4+ 4+  Knee extension 5 5  Ankle dorsiflexion 4+ 5  Ankle plantarflexion    Ankle inversion    Ankle eversion     (Blank rows = not  tested)  FUNCTIONAL TESTS:  5 times sit to stand: 10.97 sec no UE assist noted mild right knee valgus with sit to stand 2 minute walk test: 431 ft  GAIT: Distance walked: 431 ft Assistive device utilized: None Level of assistance: Modified independence Comments: noted bilateral hip internal rotation   TODAY'S TREATMENT:                                                                                                                              DATE:  05/13/23: Sidelying:  Abduction 10x Clam with RTB 10x 5" Supine:   Bridge with RTB around thigh 10x   LTR 10x 10" Standing:   3D hip excursion 10 (squat, lumbar extension, lateral flexion with UE overhead, rotate)  Abduction 10x 2 sets (RTB around thighs 2nd set)  Hip extension with RTB around ankle SLS Rt 8", Lt 12" Tandem stance 2x 30" Heel raise 15x  STS 10x eccentric control      05/11/23: Reviewed goals Educated importance of HEP compliance for maximal benefits  Seated: STS 10x  Standing: 3D hip excursion 10 (squat, lumbar extension, lateral flexion with UE overhead, rotate) Supine:  Bridge with RTB around thigh  Piriformis stretch with towel assistance 3x 30"  Sidelying: clam with RTB 10x 5"   05/03/23 physical therapy evaluation and HEP    PATIENT EDUCATION: Education details: Patient educated on exam findings, POC, scope of PT, HEP, and what to expect next visit. Person educated: Patient Education method: Explanation, Demonstration, and Handouts Education comprehension: verbalized understanding, returned demonstration, verbal cues required, and tactile cues required  HOME EXERCISE PROGRAM: Access Code: [email or web address] URL: [email or web address] Date: 05/03/2023 Prepared by: [facility] - Rehab  Exercises - Supine Figure 4 Piriformis Stretch  - 2 x daily - 7 x weekly - 1 sets - 5 reps - 20 sec hold - Standing Lumbar Extension with Counter  - 1 x daily - 7 x weekly - 1 sets - 10 reps - 2 sec hold - Supine  Piriformis Stretch with Towel  - 1  x daily - 7 x weekly - 1 sets - 5 reps - 20 sec hold  05/13/23: - Clamshell with Resistance  - 2 x daily - 7 x weekly - 1 sets - 10 reps - Squat with Chair and Counter Support  - 1 x daily - 7 x weekly - 3 sets - 10 reps - Single Leg Stance  - 3 x daily - 7 x weekly - 1 sets - 3 reps - 30" hold  ASSESSMENT:  CLINICAL IMPRESSION:  05/13/23:  Session focus with LE strengthening and mobility.  Added sidelying and standing exercises for gluteal strengthening.  Pt able to complete all exercises with cueing for squat mechanics and no reports of increased pain.  Added exercises to HEP and RTB with printout given and verbalized understanding.  No reports of pain through session.    Eval: Patient is a 67 y.o. female who was seen today for physical therapy evaluation and treatment for M70.61 (ICD-10-CM) - Greater trochanteric bursitis of right hip.Patient demonstrates muscle weakness, reduced ROM, and fascial restrictions which are likely contributing to symptoms of pain and are negatively impacting patient ability to perform ADLs and functional mobility tasks. Patient will benefit from skilled physical therapy services to address these deficits to reduce pain and improve level of function with ADLs and functional mobility tasks.   OBJECTIVE IMPAIRMENTS: Abnormal gait, decreased mobility, difficulty walking, decreased ROM, decreased strength, hypomobility, increased fascial restrictions, impaired perceived functional ability, and pain.   ACTIVITY LIMITATIONS: bending, sitting, standing, sleeping, and locomotion level  PARTICIPATION LIMITATIONS: meal prep, cleaning, driving, shopping, and yard work  [facility] POTENTIAL: Good  CLINICAL DECISION MAKING: Stable/uncomplicated  EVALUATION COMPLEXITY: Low   GOALS: Goals reviewed with patient? No  SHORT TERM GOALS: Target date: 05/17/2023 patient will be independent with initial HEP  Baseline: Goal status: IN  PROGRESS  2.  Patient will self report 30% improvement to improve tolerance for functional activity  Baseline:  Goal status: IN PROGRESS  LONG TERM GOALS: Target date: 05/31/2023  Patient will be independent in self management strategies to improve quality of life and functional outcomes.  Baseline:  Goal status: IN PROGRESS  2.  Patient will self report 50% improvement to improve tolerance for functional activity  Baseline:  Goal status: IN PROGRESS  3.  Patient will increase  leg MMTs to 5/5 without pain to promote return to ambulation community distances with minimal deviation.  Baseline: see above Goal status: IN PROGRESS  4.  Patient will increase distance on to 450 ft  to demonstrate improved functional mobility walking household and community distances.   Baseline: 431 Goal status: IN PROGRESS  5.  Patient will be able to turn in the car to the left to look for oncoming traffic without right hip pain Baseline:  Goal status: IN PROGRESS  PLAN:  PT FREQUENCY: 2x/week  PT DURATION: 4 weeks  PLANNED INTERVENTIONS: Therapeutic exercises, Therapeutic activity, Neuromuscular re-education, Balance training, Gait training, Patient/Family education, Joint manipulation, Joint mobilization, Stair training, Orthotic/Fit training, DME instructions, Aquatic Therapy, Dry Needling, Electrical stimulation, Spinal manipulation, Spinal mobilization, Cryotherapy, Moist heat, Compression bandaging, scar mobilization, Splintting, Taping, Traction, Ultrasound, Ionotophoresis 4mg /ml Dexamethasone, and Manual therapy   PLAN FOR NEXT SESSION:  right hip mobility and strength; question possible lumbar component  Becky Sax, LPTA/CLT; [facility] [phone]  Juel Burrow, PTA 05/13/2023, 4:21 PM  4:21 PM, 05/13/23

## 2023-05-18 ENCOUNTER — Encounter (HOSPITAL_COMMUNITY): Payer: Self-pay

## 2023-05-18 ENCOUNTER — Ambulatory Visit (HOSPITAL_COMMUNITY): Payer: Medicare Other

## 2023-05-18 DIAGNOSIS — M25551 Pain in right hip: Secondary | ICD-10-CM

## 2023-05-18 DIAGNOSIS — M25552 Pain in left hip: Secondary | ICD-10-CM

## 2023-05-18 DIAGNOSIS — R262 Difficulty in walking, not elsewhere classified: Secondary | ICD-10-CM | POA: Diagnosis not present

## 2023-05-18 DIAGNOSIS — R2689 Other abnormalities of gait and mobility: Secondary | ICD-10-CM

## 2023-05-18 NOTE — Therapy (Signed)
OUTPATIENT PHYSICAL THERAPY LOWER EXTREMITY TREATMENT   Patient Name: Amy Cook MRN: [id number] DOB:07/19/1956, 67 y.o., female Today's Date: 05/18/2023  END OF SESSION:  PT End of Session - 05/18/23 1433     Visit Number 4    Number of Visits 8    Date for PT Re-Evaluation 05/31/23    Authorization Type Medicare part A and [employer]    Progress Note Due on Visit 10    PT Start Time 1350    PT Stop Time 1428    PT Time Calculation (min) 38 min    Activity Tolerance Patient tolerated treatment well;Patient limited by pain    Behavior During Therapy [facility] for tasks assessed/performed              Past Medical History:  Diagnosis Date   Arthritis    Cataract 12/2016   both   Hypertension    Past Surgical History:  Procedure Laterality Date   ABDOMINAL HYSTERECTOMY  2001   complete   CESAREAN SECTION     FLEXIBLE SIGMOIDOSCOPY N/A 08/21/2021   Procedure: FLEXIBLE SIGMOIDOSCOPY;  Surgeon: Malissa Hippo, MD;  Location: [facility];  Service: Endoscopy;  Laterality: N/A;   Patient Active Problem List   Diagnosis Date Noted   Hypertension 04/12/2017    PCP: Lenetta Quaker, MD  REFERRING PROVIDER: Jodi Geralds, MD Next apt 06/01/23  REFERRING DIAG: M70.61 (ICD-10-CM) - Greater trochanteric bursitis of right hip  THERAPY DIAG:  Difficulty in walking, not elsewhere classified  Pain in right hip  Pain in left hip  Other abnormalities of gait and mobility  Rationale for Evaluation and Treatment: Rehabilitation  ONSET DATE: 12/02/22 s/p right THR  SUBJECTIVE:   SUBJECTIVE STATEMENT: 05/18/23:  Pt stated she feels the cortizone shot is wearing off, reports pain in Rt groin and lateral aspect, sharp pain with rotation movements.  Eval:  s/p rt ant THR on 12/02/2022; since has had some lingering pain.  Had a cortisone shot 2 weeks ago that helped a little bit.  some "zinging" pain when driving and turning to the left; cannot sleep on right side.   Feels she can do stuff but just hurts.   PERTINENT HISTORY: s/p rt ant THR on 12/02/2022    PAIN:  Are you having pain? Yes: NPRS scale: 2/10 Pain location: right hip Pain description: quick sharp occasionally with twisting Aggravating factors: twisting her trunk; sleeping on right side Relieving factors: tylenol;motrin  PRECAUTIONS: None     WEIGHT BEARING RESTRICTIONS: No  FALLS:  Has patient fallen in last 6 months? Yes. Number of falls 1  OCCUPATION: not working  PLOF: Independent  PATIENT GOALS: no hip pain  NEXT MD VISIT: 06/01/23  OBJECTIVE:   DIAGNOSTIC FINDINGS: none in epic but per patient had x-ray at Dr. Luiz Blare unremarkable ex  PATIENT SURVEYS:  FOTO 77  COGNITION: Overall cognitive status: Within functional limits for tasks assessed     SENSATION: WFL  EDEMA:  None noted  POSTURE: rounded shoulders and flexed trunk   PALPATION: Tender right side greater troch and around  LUMBAR ROM:   Active  A/PROM  eval  Flexion 80% available  Extension 30% available  Right lateral flexion To 1" past right knee joint line  Left lateral flexion To knee joint line  Right rotation   Left rotation    (Blank rows = not tested)  LOWER EXTREMITY ROM: noted bilateral hip internal rotation in standing "pigeon toed"  Active ROM Right  eval Left eval  Hip flexion    Hip extension    Hip abduction    Hip adduction    Hip internal rotation    Hip external rotation limited Limited > right  Knee flexion    Knee extension    Ankle dorsiflexion    Ankle plantarflexion    Ankle inversion    Ankle eversion     (Blank rows = not tested)  LOWER EXTREMITY MMT:  MMT Right eval Left eval  Hip flexion 4+ 5  Hip extension 4+ 4+  Hip abduction    Hip adduction    Hip internal rotation 4 4  Hip external rotation 3+ 3+  Knee flexion 4+ 4+  Knee extension 5 5  Ankle dorsiflexion 4+ 5  Ankle plantarflexion    Ankle inversion    Ankle eversion      (Blank rows = not tested)  FUNCTIONAL TESTS:  5 times sit to stand: 10.97 sec no UE assist noted mild right knee valgus with sit to stand 2 minute walk test: 431 ft  GAIT: Distance walked: 431 ft Assistive device utilized: None Level of assistance: Modified independence Comments: noted bilateral hip internal rotation   TODAY'S TREATMENT:                                                                                                                              DATE:  05/18/23: Standing: 3D hip excursion 10 (squat, lumbar extension, lateral flexion with UE overhead, rotate (pain with rotation to Lt) Vector stance 3x 5" Checked SI alignment with equal LLD, does present with decreased ER (pt ambulates pigeon toed)  Supine: Isometric adduction with ball then abduction pushing into belt 10x 5" LTR 5x 10" Piriformis stretch 90/90  Sidelying: Clam 10x5"  Seated: pirifimis stretch- increased groin pain  05/13/23: Sidelying:  Abduction 10x Clam with RTB 10x 5" Supine:   Bridge with RTB around thigh 10x   LTR 10x 10" Standing:   3D hip excursion 10 (squat, lumbar extension, lateral flexion with UE overhead, rotate)  Abduction 10x 2 sets (RTB around thighs 2nd set)  Hip extension with RTB around ankle SLS Rt 8", Lt 12" Tandem stance 2x 30" Heel raise 15x  STS 10x eccentric control      05/11/23: Reviewed goals Educated importance of HEP compliance for maximal benefits  Seated: STS 10x  Standing: 3D hip excursion 10 (squat, lumbar extension, lateral flexion with UE overhead, rotate) Supine:  Bridge with RTB around thigh  Piriformis stretch with towel assistance 3x 30"  Sidelying: clam with RTB 10x 5"   05/03/23 physical therapy evaluation and HEP    PATIENT EDUCATION: Education details: Patient educated on exam findings, POC, scope of PT, HEP, and what to expect next visit. Person educated: Patient Education method: Explanation, Demonstration, and  Handouts Education comprehension: verbalized understanding, returned demonstration, verbal cues required, and tactile cues required  HOME EXERCISE PROGRAM: Access Code: [email or web address] URL: [email or web address] Date:  05/03/2023 Prepared by: [facility]  Exercises - Supine Figure 4 Piriformis Stretch  - 2 x daily - 7 x weekly - 1 sets - 5 reps - 20 sec hold - Standing Lumbar Extension with Counter  - 1 x daily - 7 x weekly - 1 sets - 10 reps - 2 sec hold - Supine Piriformis Stretch with Towel  - 1 x daily - 7 x weekly - 1 sets - 5 reps - 20 sec hold  05/13/23: - Clamshell with Resistance  - 2 x daily - 7 x weekly - 1 sets - 10 reps - Squat with Chair and Counter Support  - 1 x daily - 7 x weekly - 3 sets - 10 reps - Single Leg Stance  - 3 x daily - 7 x weekly - 1 sets - 3 reps - 30" hold  ASSESSMENT:  CLINICAL IMPRESSION: 05/18/23:  Pt limited with Rt groin pain this session, increased with hip rotation movements.  Checked SI alignment with good alignment and equal leg length.  Added isometric pelvic strengthening exercises and stretches for hip mobility.  EOS pt limited with groin pain with no change in pain scale.  Eval: Patient is a 67 y.o. female who was seen today for physical therapy evaluation and treatment for M70.61 (ICD-10-CM) - Greater trochanteric bursitis of right hip.Patient demonstrates muscle weakness, reduced ROM, and fascial restrictions which are likely contributing to symptoms of pain and are negatively impacting patient ability to perform ADLs and functional mobility tasks. Patient will benefit from skilled physical therapy services to address these deficits to reduce pain and improve level of function with ADLs and functional mobility tasks.   OBJECTIVE IMPAIRMENTS: Abnormal gait, decreased mobility, difficulty walking, decreased ROM, decreased strength, hypomobility, increased fascial restrictions, impaired perceived functional ability, and pain.   ACTIVITY  LIMITATIONS: bending, sitting, standing, sleeping, and locomotion level  PARTICIPATION LIMITATIONS: meal prep, cleaning, driving, shopping, and yard work  [facility] POTENTIAL: Good  CLINICAL DECISION MAKING: Stable/uncomplicated  EVALUATION COMPLEXITY: Low   GOALS: Goals reviewed with patient? No  SHORT TERM GOALS: Target date: 05/17/2023 patient will be independent with initial HEP  Baseline: Goal status: IN PROGRESS  2.  Patient will self report 30% improvement to improve tolerance for functional activity  Baseline:  Goal status: IN PROGRESS  LONG TERM GOALS: Target date: 05/31/2023  Patient will be independent in self management strategies to improve quality of life and functional outcomes.  Baseline:  Goal status: IN PROGRESS  2.  Patient will self report 50% improvement to improve tolerance for functional activity  Baseline:  Goal status: IN PROGRESS  3.  Patient will increase  leg MMTs to 5/5 without pain to promote return to ambulation community distances with minimal deviation.  Baseline: see above Goal status: IN PROGRESS  4.  Patient will increase distance on to 450 ft  to demonstrate improved functional mobility walking household and community distances.   Baseline: 431 Goal status: IN PROGRESS  5.  Patient will be able to turn in the car to the left to look for oncoming traffic without right hip pain Baseline:  Goal status: IN PROGRESS  PLAN:  PT FREQUENCY: 2x/week  PT DURATION: 4 weeks  PLANNED INTERVENTIONS: Therapeutic exercises, Therapeutic activity, Neuromuscular re-education, Balance training, Gait training, Patient/Family education, Joint manipulation, Joint mobilization, Stair training, Orthotic/Fit training, DME instructions, Aquatic Therapy, Dry Needling, Electrical stimulation, Spinal manipulation, Spinal mobilization, Cryotherapy, Moist heat, Compression bandaging, scar mobilization, Splintting, Taping, Traction, Ultrasound,  Ionotophoresis  4mg /ml Dexamethasone, and Manual therapy   PLAN FOR NEXT SESSION:  right hip mobility and strength; question possible lumbar component  Becky Sax, LPTA/CLT; CBIS [phone]  Juel Burrow, PTA 05/18/2023, 2:34 PM  2:34 PM, 05/18/23

## 2023-05-20 ENCOUNTER — Encounter (HOSPITAL_COMMUNITY): Payer: Self-pay

## 2023-05-20 ENCOUNTER — Ambulatory Visit (HOSPITAL_COMMUNITY): Payer: Medicare Other

## 2023-05-20 DIAGNOSIS — M25551 Pain in right hip: Secondary | ICD-10-CM

## 2023-05-20 DIAGNOSIS — R262 Difficulty in walking, not elsewhere classified: Secondary | ICD-10-CM | POA: Diagnosis not present

## 2023-05-20 DIAGNOSIS — R2689 Other abnormalities of gait and mobility: Secondary | ICD-10-CM

## 2023-05-20 NOTE — Therapy (Addendum)
OUTPATIENT PHYSICAL THERAPY LOWER EXTREMITY TREATMENT/DISCHARGE PHYSICAL THERAPY DISCHARGE SUMMARY  Visits from Start of Care: 5  Current functional level related to goals / functional outcomes: See below   Remaining deficits: See below   Education / Equipment: HEP   Patient agrees to discharge. Patient goals were not met. Patient is being discharged due to did not respond to therapy.     Patient Name: Amy Cook MRN: [id number] DOB:[birth year], 67 y.o., female Today's Date: 05/20/2023  END OF SESSION:  PT End of Session - 05/20/23 1255     Visit Number 5    Number of Visits 8    Date for PT Re-Evaluation 05/31/23    Authorization Type Medicare part A and [employer]    Progress Note Due on Visit 10    PT Start Time 1259    PT Stop Time 1338    PT Time Calculation (min) 39 min    Activity Tolerance Patient tolerated treatment well;Patient limited by pain    Behavior During Therapy [facility] for tasks assessed/performed              Past Medical History:  Diagnosis Date   Arthritis    Cataract 12/2016   both   Hypertension    Past Surgical History:  Procedure Laterality Date   ABDOMINAL HYSTERECTOMY  2001   complete   CESAREAN SECTION     FLEXIBLE SIGMOIDOSCOPY N/A 08/21/2021   Procedure: FLEXIBLE SIGMOIDOSCOPY;  Surgeon: Malissa Hippo, MD;  Location: [facility];  Service: Endoscopy;  Laterality: N/A;   Patient Active Problem List   Diagnosis Date Noted   Hypertension 04/12/2017    PCP: Lenetta Quaker, MD  REFERRING PROVIDER: Jodi Geralds, MD Next apt 06/01/23  REFERRING DIAG: M70.61 (ICD-10-CM) - Greater trochanteric bursitis of right hip  THERAPY DIAG:  Difficulty in walking, not elsewhere classified  Pain in right hip  Other abnormalities of gait and mobility  Rationale for Evaluation and Treatment: Rehabilitation  ONSET DATE: 12/02/22 s/p right THR  SUBJECTIVE:   SUBJECTIVE STATEMENT: 05/20/23 sharp pain right hip continues ;  seems to be getting worse; sees MD 10/8; turning to get up in the bed hurts  Eval:  s/p rt ant THR on 12/02/2022; since has had some lingering pain.  Had a cortisone shot 2 weeks ago that helped a little bit.  some "zinging" pain when driving and turning to the left; cannot sleep on right side.  Feels she can do stuff but just hurts.   PERTINENT HISTORY: s/p rt ant THR on 12/02/2022    PAIN:  Are you having pain? Yes: NPRS scale: 2/10 Pain location: right hip Pain description: quick sharp occasionally with twisting Aggravating factors: twisting her trunk; sleeping on right side Relieving factors: tylenol;motrin  PRECAUTIONS: None     WEIGHT BEARING RESTRICTIONS: No  FALLS:  Has patient fallen in last 6 months? Yes. Number of falls 1  OCCUPATION: not working  PLOF: Independent  PATIENT GOALS: no hip pain  NEXT MD VISIT: 06/01/23  OBJECTIVE:   DIAGNOSTIC FINDINGS: none in epic but per patient had x-ray at Dr. Luiz Blare unremarkable ex  PATIENT SURVEYS:  FOTO 77  COGNITION: Overall cognitive status: Within functional limits for tasks assessed     SENSATION: WFL  EDEMA:  None noted  POSTURE: rounded shoulders and flexed trunk   PALPATION: Tender right side greater troch and around  LUMBAR ROM:   Active  A/PROM  eval  Flexion 80% available  Extension 30%  available  Right lateral flexion To 1" past right knee joint line  Left lateral flexion To knee joint line  Right rotation   Left rotation    (Blank rows = not tested)  LOWER EXTREMITY ROM: noted bilateral hip internal rotation in standing "pigeon toed"  Active ROM Right eval Left eval  Hip flexion    Hip extension    Hip abduction    Hip adduction    Hip internal rotation    Hip external rotation limited Limited > right  Knee flexion    Knee extension    Ankle dorsiflexion    Ankle plantarflexion    Ankle inversion    Ankle eversion     (Blank rows = not tested)  LOWER EXTREMITY  MMT:  MMT Right eval Left eval  Hip flexion 4+ 5  Hip extension 4+ 4+  Hip abduction    Hip adduction    Hip internal rotation 4 4  Hip external rotation 3+ 3+  Knee flexion 4+ 4+  Knee extension 5 5  Ankle dorsiflexion 4+ 5  Ankle plantarflexion    Ankle inversion    Ankle eversion     (Blank rows = not tested)  FUNCTIONAL TESTS:  5 times sit to stand: 10.97 sec no UE assist noted mild right knee valgus with sit to stand 2 minute walk test: 431 ft  GAIT: Distance walked: 431 ft Assistive device utilized: None Level of assistance: Modified independence Comments: noted bilateral hip internal rotation   TODAY'S TREATMENT:                                                                                                                              DATE:  05/20/23 Pain with seated figure 4 on right; right groin Standing: Lumbar flexion repeated x 8              Extension repeated x 8 Sidebending to knee joint bilaterally Prone press ups x 8 no change in symptoms Seated lumbar flexion x 10 reps no changes Standing with right foot in chair; hip flexed x 10 no change in symptoms   05/18/23: Standing: 3D hip excursion 10 (squat, lumbar extension, lateral flexion with UE overhead, rotate (pain with rotation to Lt) Vector stance 3x 5" Checked SI alignment with equal LLD, does present with decreased ER (pt ambulates pigeon toed)  Supine: Isometric adduction with ball then abduction pushing into belt 10x 5" LTR 5x 10" Piriformis stretch 90/90  Sidelying: Clam 10x5"  Seated: pirifimis stretch- increased groin pain  05/13/23: Sidelying:  Abduction 10x Clam with RTB 10x 5" Supine:   Bridge with RTB around thigh 10x   LTR 10x 10" Standing:   3D hip excursion 10 (squat, lumbar extension, lateral flexion with UE overhead, rotate)  Abduction 10x 2 sets (RTB around thighs 2nd set)  Hip extension with RTB around ankle SLS Rt 8", Lt 12" Tandem stance 2x 30" Heel raise  15x  STS  10x eccentric control      05/11/23: Reviewed goals Educated importance of HEP compliance for maximal benefits  Seated: STS 10x  Standing: 3D hip excursion 10 (squat, lumbar extension, lateral flexion with UE overhead, rotate) Supine:  Bridge with RTB around thigh  Piriformis stretch with towel assistance 3x 30"  Sidelying: clam with RTB 10x 5"   05/03/23 physical therapy evaluation and HEP    PATIENT EDUCATION: Education details: Patient educated on exam findings, POC, scope of PT, HEP, and what to expect next visit. Person educated: Patient Education method: Explanation, Demonstration, and Handouts Education comprehension: verbalized understanding, returned demonstration, verbal cues required, and tactile cues required  HOME EXERCISE PROGRAM: Access Code: [email or web address] URL: [email or web address] Date: 05/03/2023 Prepared by: [facility]  Exercises - Supine Figure 4 Piriformis Stretch  - 2 x daily - 7 x weekly - 1 sets - 5 reps - 20 sec hold - Standing Lumbar Extension with Counter  - 1 x daily - 7 x weekly - 1 sets - 10 reps - 2 sec hold - Supine Piriformis Stretch with Towel  - 1 x daily - 7 x weekly - 1 sets - 5 reps - 20 sec hold  05/13/23: - Clamshell with Resistance  - 2 x daily - 7 x weekly - 1 sets - 10 reps - Squat with Chair and Counter Support  - 1 x daily - 7 x weekly - 3 sets - 10 reps - Single Leg Stance  - 3 x daily - 7 x weekly - 1 sets - 3 reps - 30" hold  ASSESSMENT:  CLINICAL IMPRESSION: 05/20/23:  patient seems to have increased hip pain especially with hip rotation. Of note most painful position today is sitting with right leg in a figure four position.  Trial of repeated movements of the lumbar spine but no movement changes her symptoms.  Continued pain in the figure four position; she reports pain in the groin area and around the hip joint.  Patient is currently not benefiting from therapy so will discharge at this time as patient  needs possible further testing and alternative treatment.     Eval: Patient is a 67 y.o. female who was seen today for physical therapy evaluation and treatment for M70.61 (ICD-10-CM) - Greater trochanteric bursitis of right hip.Patient demonstrates muscle weakness, reduced ROM, and fascial restrictions which are likely contributing to symptoms of pain and are negatively impacting patient ability to perform ADLs and functional mobility tasks. Patient will benefit from skilled physical therapy services to address these deficits to reduce pain and improve level of function with ADLs and functional mobility tasks.   OBJECTIVE IMPAIRMENTS: Abnormal gait, decreased mobility, difficulty walking, decreased ROM, decreased strength, hypomobility, increased fascial restrictions, impaired perceived functional ability, and pain.   ACTIVITY LIMITATIONS: bending, sitting, standing, sleeping, and locomotion level  PARTICIPATION LIMITATIONS: meal prep, cleaning, driving, shopping, and yard work  [facility] POTENTIAL: Good  CLINICAL DECISION MAKING: Stable/uncomplicated  EVALUATION COMPLEXITY: Low   GOALS: Goals reviewed with patient? No  SHORT TERM GOALS: Target date: 05/17/2023 patient will be independent with initial HEP  Baseline: Goal status: IN PROGRESS  2.  Patient will self report 30% improvement to improve tolerance for functional activity  Baseline:  Goal status: IN PROGRESS  LONG TERM GOALS: Target date: 05/31/2023  Patient will be independent in self management strategies to improve quality of life and functional outcomes.  Baseline:  Goal status: IN PROGRESS  2.  Patient will self report 50% improvement to  improve tolerance for functional activity  Baseline:  Goal status: IN PROGRESS  3.  Patient will increase  leg MMTs to 5/5 without pain to promote return to ambulation community distances with minimal deviation.  Baseline: see above Goal status: IN PROGRESS  4.  Patient  will increase distance on to 450 ft  to demonstrate improved functional mobility walking household and community distances.   Baseline: 431 Goal status: IN PROGRESS  5.  Patient will be able to turn in the car to the left to look for oncoming traffic without right hip pain Baseline:  Goal status: IN PROGRESS  PLAN:  PT FREQUENCY: 2x/week  PT DURATION: 4 weeks  PLANNED INTERVENTIONS: Therapeutic exercises, Therapeutic activity, Neuromuscular re-education, Balance training, Gait training, Patient/Family education, Joint manipulation, Joint mobilization, Stair training, Orthotic/Fit training, DME instructions, Aquatic Therapy, Dry Needling, Electrical stimulation, Spinal manipulation, Spinal mobilization, Cryotherapy, Moist heat, Compression bandaging, scar mobilization, Splintting, Taping, Traction, Ultrasound, Ionotophoresis 4mg /ml Dexamethasone, and Manual therapy   PLAN FOR NEXT SESSION:  discharge  1:44 PM, 05/20/23 Demarlo Riojas Small Arta Stump MPT [place] [facility] [place] [phone] Ph:[phone]

## 2023-05-20 NOTE — Therapy (Signed)
OUTPATIENT PHYSICAL THERAPY LOWER EXTREMITY TREATMENT   Patient Name: Amy Cook MRN: [id number] DOB:03-12-1956, 67 y.o., female Today's Date: 05/20/2023  END OF SESSION:     Past Medical History:  Diagnosis Date   Arthritis    Cataract 12/2016   both   Hypertension    Past Surgical History:  Procedure Laterality Date   ABDOMINAL HYSTERECTOMY  2001   complete   CESAREAN SECTION     FLEXIBLE SIGMOIDOSCOPY N/A 08/21/2021   Procedure: FLEXIBLE SIGMOIDOSCOPY;  Surgeon: Malissa Hippo, MD;  Location: [facility];  Service: Endoscopy;  Laterality: N/A;   Patient Active Problem List   Diagnosis Date Noted   Hypertension 04/12/2017    PCP: Lenetta Quaker, MD  REFERRING PROVIDER: Jodi Geralds, MD Next apt 06/01/23  REFERRING DIAG: M70.61 (ICD-10-CM) - Greater trochanteric bursitis of right hip  THERAPY DIAG:  No diagnosis found.  Rationale for Evaluation and Treatment: Rehabilitation  ONSET DATE: 12/02/22 s/p right THR  SUBJECTIVE:   SUBJECTIVE STATEMENT: 05/18/23:  Pt stated she feels the cortizone shot is wearing off, reports pain in Rt groin and lateral aspect, sharp pain with rotation movements.  Eval:  s/p rt ant THR on 12/02/2022; since has had some lingering pain.  Had a cortisone shot 2 weeks ago that helped a little bit.  some "zinging" pain when driving and turning to the left; cannot sleep on right side.  Feels she can do stuff but just hurts.   PERTINENT HISTORY: s/p rt ant THR on 12/02/2022    PAIN:  Are you having pain? Yes: NPRS scale: 2/10 Pain location: right hip Pain description: quick sharp occasionally with twisting Aggravating factors: twisting her trunk; sleeping on right side Relieving factors: tylenol;motrin  PRECAUTIONS: None     WEIGHT BEARING RESTRICTIONS: No  FALLS:  Has patient fallen in last 6 months? Yes. Number of falls 1  OCCUPATION: not working  PLOF: Independent  PATIENT GOALS: no hip pain  NEXT MD VISIT:  06/01/23  OBJECTIVE:   DIAGNOSTIC FINDINGS: none in epic but per patient had x-ray at Dr. Luiz Blare unremarkable ex  PATIENT SURVEYS:  FOTO 77  COGNITION: Overall cognitive status: Within functional limits for tasks assessed     SENSATION: WFL  EDEMA:  None noted  POSTURE: rounded shoulders and flexed trunk   PALPATION: Tender right side greater troch and around  LUMBAR ROM:   Active  A/PROM  eval  Flexion 80% available  Extension 30% available  Right lateral flexion To 1" past right knee joint line  Left lateral flexion To knee joint line  Right rotation   Left rotation    (Blank rows = not tested)  LOWER EXTREMITY ROM: noted bilateral hip internal rotation in standing "pigeon toed"  Active ROM Right eval Left eval  Hip flexion    Hip extension    Hip abduction    Hip adduction    Hip internal rotation    Hip external rotation limited Limited > right  Knee flexion    Knee extension    Ankle dorsiflexion    Ankle plantarflexion    Ankle inversion    Ankle eversion     (Blank rows = not tested)  LOWER EXTREMITY MMT:  MMT Right eval Left eval  Hip flexion 4+ 5  Hip extension 4+ 4+  Hip abduction    Hip adduction    Hip internal rotation 4 4  Hip external rotation 3+ 3+  Knee flexion 4+ 4+  Knee extension 5  5  Ankle dorsiflexion 4+ 5  Ankle plantarflexion    Ankle inversion    Ankle eversion     (Blank rows = not tested)  FUNCTIONAL TESTS:  5 times sit to stand: 10.97 sec no UE assist noted mild right knee valgus with sit to stand 2 minute walk test: 431 ft  GAIT: Distance walked: 431 ft Assistive device utilized: None Level of assistance: Modified independence Comments: noted bilateral hip internal rotation   TODAY'S TREATMENT:                                                                                                                              DATE:  05/18/23: Standing: 3D hip excursion 10 (squat, lumbar extension, lateral  flexion with UE overhead, rotate (pain with rotation to Lt) Vector stance 3x 5" Checked SI alignment with equal LLD, does present with decreased ER (pt ambulates pigeon toed)  Supine: Isometric adduction with ball then abduction pushing into belt 10x 5" LTR 5x 10" Piriformis stretch 90/90  Sidelying: Clam 10x5"  Seated: pirifimis stretch- increased groin pain  05/13/23: Sidelying:  Abduction 10x Clam with RTB 10x 5" Supine:   Bridge with RTB around thigh 10x   LTR 10x 10" Standing:   3D hip excursion 10 (squat, lumbar extension, lateral flexion with UE overhead, rotate)  Abduction 10x 2 sets (RTB around thighs 2nd set)  Hip extension with RTB around ankle SLS Rt 8", Lt 12" Tandem stance 2x 30" Heel raise 15x  STS 10x eccentric control      05/11/23: Reviewed goals Educated importance of HEP compliance for maximal benefits  Seated: STS 10x  Standing: 3D hip excursion 10 (squat, lumbar extension, lateral flexion with UE overhead, rotate) Supine:  Bridge with RTB around thigh  Piriformis stretch with towel assistance 3x 30"  Sidelying: clam with RTB 10x 5"   05/03/23 physical therapy evaluation and HEP    PATIENT EDUCATION: Education details: Patient educated on exam findings, POC, scope of PT, HEP, and what to expect next visit. Person educated: Patient Education method: Explanation, Demonstration, and Handouts Education comprehension: verbalized understanding, returned demonstration, verbal cues required, and tactile cues required  HOME EXERCISE PROGRAM: Access Code: [email or web address] URL: [email or web address] Date: 05/03/2023 Prepared by: [facility]  Exercises - Supine Figure 4 Piriformis Stretch  - 2 x daily - 7 x weekly - 1 sets - 5 reps - 20 sec hold - Standing Lumbar Extension with Counter  - 1 x daily - 7 x weekly - 1 sets - 10 reps - 2 sec hold - Supine Piriformis Stretch with Towel  - 1 x daily - 7 x weekly - 1 sets - 5 reps - 20 sec  hold  05/13/23: - Clamshell with Resistance  - 2 x daily - 7 x weekly - 1 sets - 10 reps - Squat with Chair and Counter Support  - 1 x daily - 7 x weekly - 3 sets - 10  reps - Single Leg Stance  - 3 x daily - 7 x weekly - 1 sets - 3 reps - 30" hold  ASSESSMENT:  CLINICAL IMPRESSION: 05/18/23:  Pt limited with Rt groin pain this session, increased with hip rotation movements.  Checked SI alignment with good alignment and equal leg length.  Added isometric pelvic strengthening exercises and stretches for hip mobility.  EOS pt limited with groin pain with no change in pain scale.  Eval: Patient is a 67 y.o. female who was seen today for physical therapy evaluation and treatment for M70.61 (ICD-10-CM) - Greater trochanteric bursitis of right hip.Patient demonstrates muscle weakness, reduced ROM, and fascial restrictions which are likely contributing to symptoms of pain and are negatively impacting patient ability to perform ADLs and functional mobility tasks. Patient will benefit from skilled physical therapy services to address these deficits to reduce pain and improve level of function with ADLs and functional mobility tasks.   OBJECTIVE IMPAIRMENTS: Abnormal gait, decreased mobility, difficulty walking, decreased ROM, decreased strength, hypomobility, increased fascial restrictions, impaired perceived functional ability, and pain.   ACTIVITY LIMITATIONS: bending, sitting, standing, sleeping, and locomotion level  PARTICIPATION LIMITATIONS: meal prep, cleaning, driving, shopping, and yard work  [facility] POTENTIAL: Good  CLINICAL DECISION MAKING: Stable/uncomplicated  EVALUATION COMPLEXITY: Low   GOALS: Goals reviewed with patient? No  SHORT TERM GOALS: Target date: 05/17/2023 patient will be independent with initial HEP  Baseline: Goal status: IN PROGRESS  2.  Patient will self report 30% improvement to improve tolerance for functional activity  Baseline:  Goal status: IN  PROGRESS  LONG TERM GOALS: Target date: 05/31/2023  Patient will be independent in self management strategies to improve quality of life and functional outcomes.  Baseline:  Goal status: IN PROGRESS  2.  Patient will self report 50% improvement to improve tolerance for functional activity  Baseline:  Goal status: IN PROGRESS  3.  Patient will increase  leg MMTs to 5/5 without pain to promote return to ambulation community distances with minimal deviation.  Baseline: see above Goal status: IN PROGRESS  4.  Patient will increase distance on to 450 ft  to demonstrate improved functional mobility walking household and community distances.   Baseline: 431 Goal status: IN PROGRESS  5.  Patient will be able to turn in the car to the left to look for oncoming traffic without right hip pain Baseline:  Goal status: IN PROGRESS  PLAN:  PT FREQUENCY: 2x/week  PT DURATION: 4 weeks  PLANNED INTERVENTIONS: Therapeutic exercises, Therapeutic activity, Neuromuscular re-education, Balance training, Gait training, Patient/Family education, Joint manipulation, Joint mobilization, Stair training, Orthotic/Fit training, DME instructions, Aquatic Therapy, Dry Needling, Electrical stimulation, Spinal manipulation, Spinal mobilization, Cryotherapy, Moist heat, Compression bandaging, scar mobilization, Splintting, Taping, Traction, Ultrasound, Ionotophoresis 4mg /ml Dexamethasone, and Manual therapy   PLAN FOR NEXT SESSION:  right hip mobility and strength; question possible lumbar component  Becky Sax, LPTA/CLT; CBIS [phone]  Juel Burrow, PTA 05/20/2023, 12:46 PM  12:46 PM, 05/20/23

## 2023-05-25 ENCOUNTER — Encounter (HOSPITAL_COMMUNITY): Payer: PRIVATE HEALTH INSURANCE

## 2023-05-27 ENCOUNTER — Encounter (HOSPITAL_COMMUNITY): Payer: PRIVATE HEALTH INSURANCE

## 2023-06-01 ENCOUNTER — Encounter (HOSPITAL_COMMUNITY): Payer: PRIVATE HEALTH INSURANCE

## 2023-06-03 ENCOUNTER — Encounter (HOSPITAL_COMMUNITY): Payer: PRIVATE HEALTH INSURANCE

## 2023-07-13 ENCOUNTER — Other Ambulatory Visit: Payer: Self-pay | Admitting: Orthopedic Surgery

## 2023-07-13 DIAGNOSIS — T84030A Mechanical loosening of internal right hip prosthetic joint, initial encounter: Secondary | ICD-10-CM

## 2023-07-19 ENCOUNTER — Ambulatory Visit
Admission: RE | Admit: 2023-07-19 | Discharge: 2023-07-19 | Disposition: A | Discharge status: Home / Self Care | Payer: Medicare Other | Source: Ambulatory Visit | Attending: Orthopedic Surgery | Admitting: Orthopedic Surgery

## 2023-07-19 DIAGNOSIS — T84030A Mechanical loosening of internal right hip prosthetic joint, initial encounter: Secondary | ICD-10-CM

## 2023-07-20 ENCOUNTER — Other Ambulatory Visit: Payer: PRIVATE HEALTH INSURANCE

## 2024-01-06 IMAGING — CT CT VIRTUAL COLONOSCOPY DIAGNOSTIC
2 of 9 series · 12 of 46 positions shown, 18 images · non-contrast
Comparison: None.

CLINICAL DATA: Incomplete colonoscopy

EXAM:
CT VIRTUAL COLONOSCOPY DIAGNOSTIC
TECHNIQUE: The patient was given a standard bowel preparation with Gastrografin
and barium for fluid and stool tagging respectively. The quality of
the bowel preparation is poor. Automated CO2 insufflation of the
colon was performed prior to image acquisition and colonic
distention is poor. Image post processing was used to generate a 3D
endoluminal fly-through projection of the colon and to
electronically subtract stool/fluid as appropriate.

[Series 6: supine colon 3.00 br40 s3 cor supine · coronal · 0.89mm/px · 3 of 92 slices shown]
[im 23/92  soft-tissue]
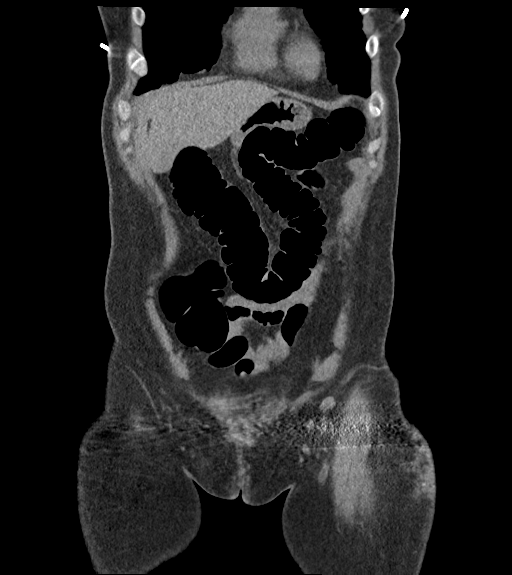
[im 46/92  soft-tissue]
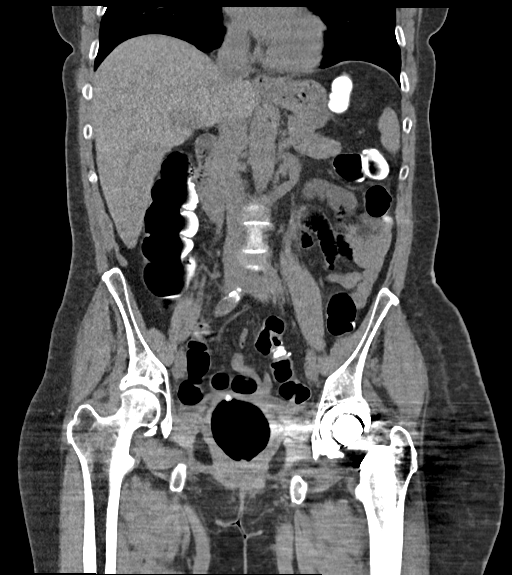
[im 69/92  soft-tissue]
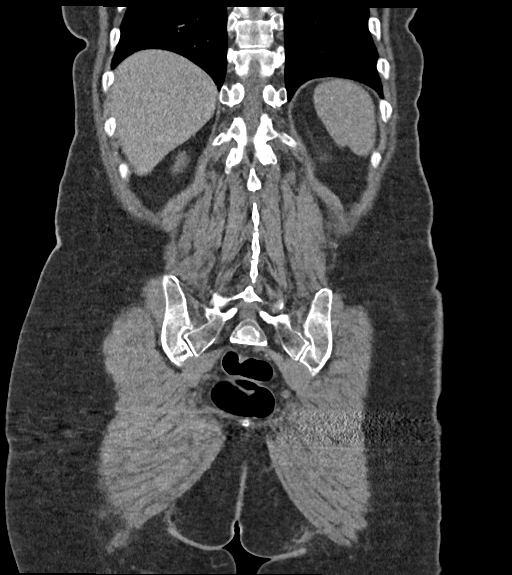

[Series 11: prone colon 1.50 br40 s3 prone thin · axial · 0.86mm/px · z∈[+1305,+1715]mm · 9 of 343 slices shown, 15 images]
[im 35/343  soft-tissue]
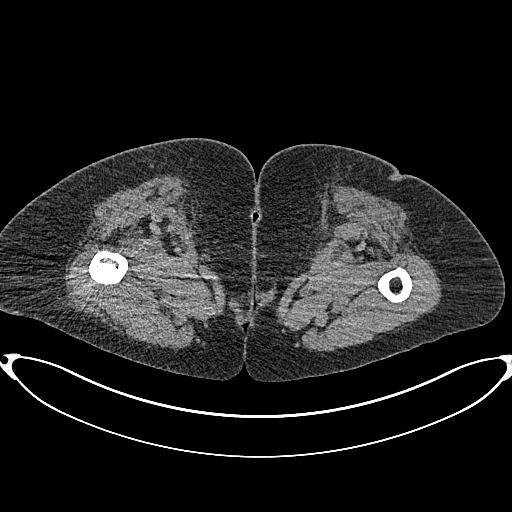
[im 35/343  bone]
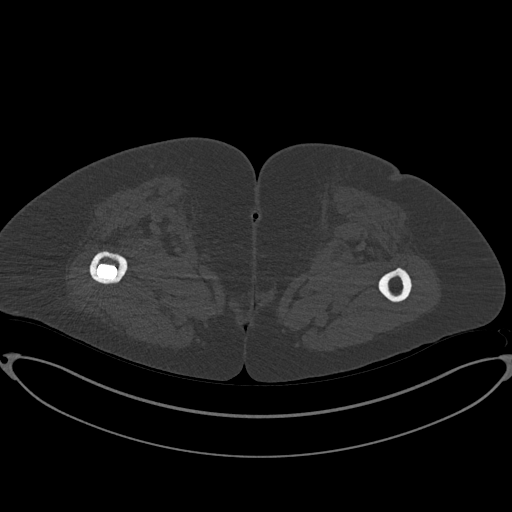
[im 69/343  soft-tissue]
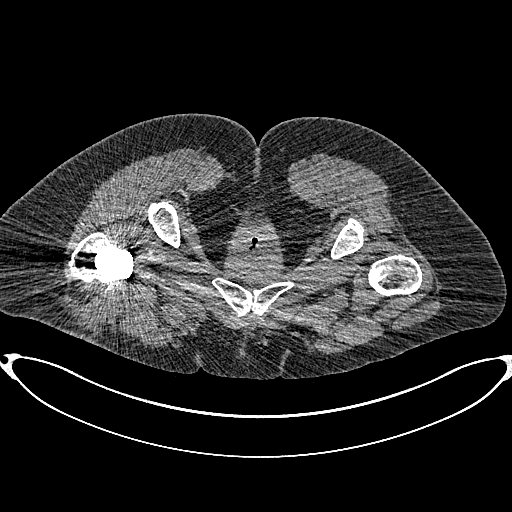
[im 103/343  soft-tissue]
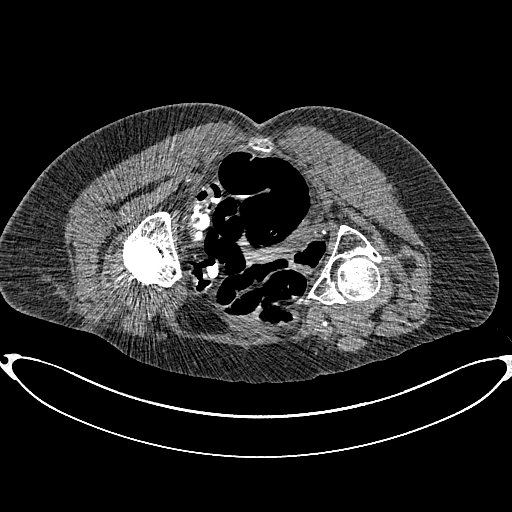
[im 137/343  soft-tissue]
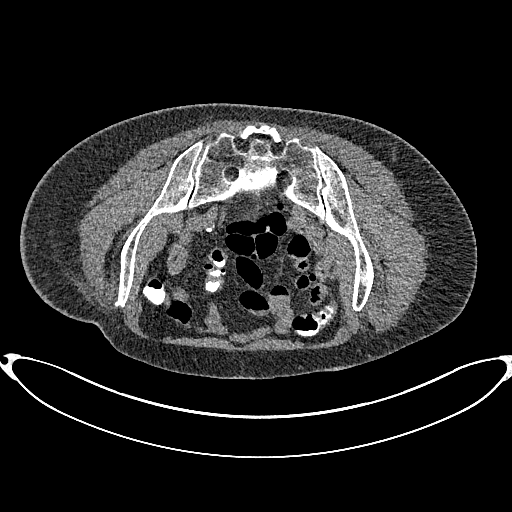
[im 172/343  soft-tissue]
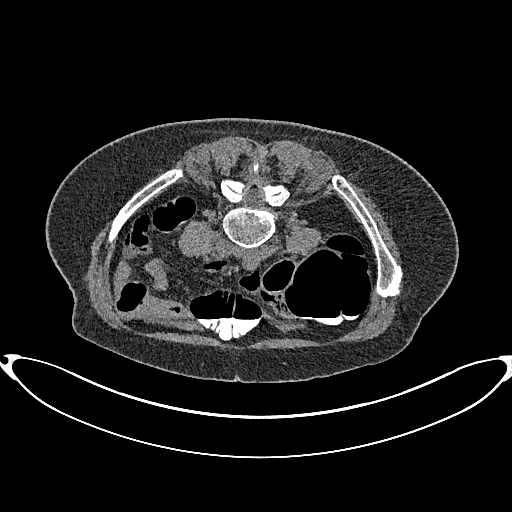
[im 206/343  soft-tissue]
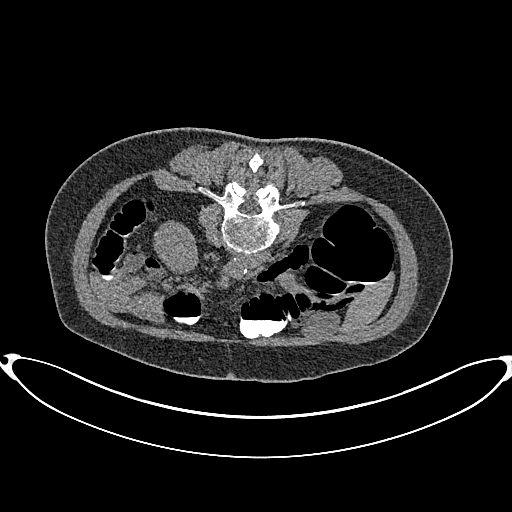
[im 206/343  lung]
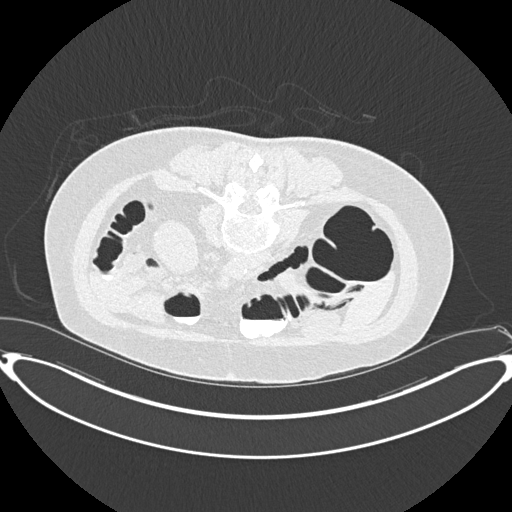
[im 240/343  soft-tissue]
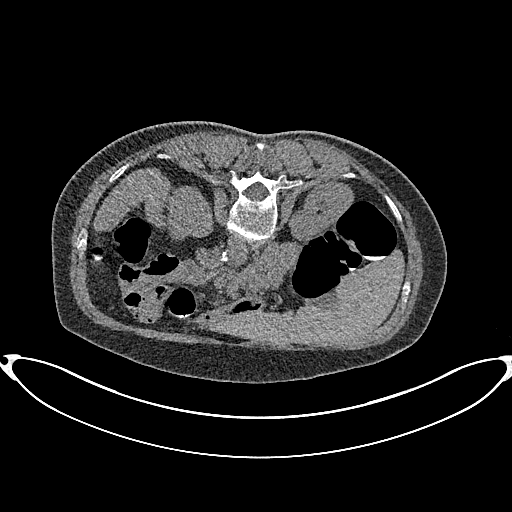
[im 240/343  lung]
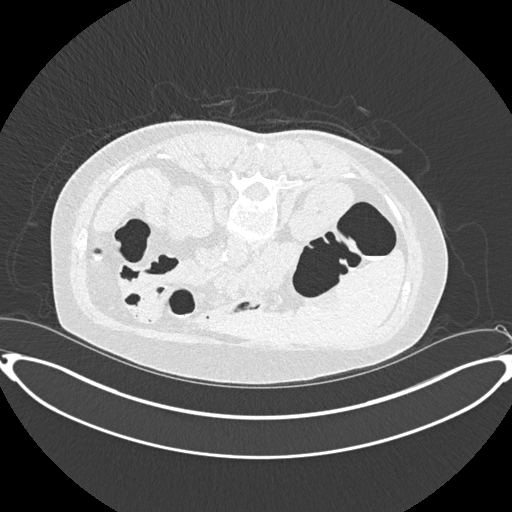
[im 274/343  soft-tissue]
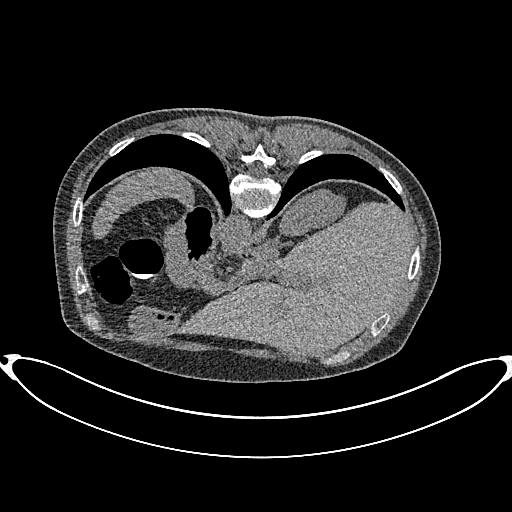
[im 274/343  lung]
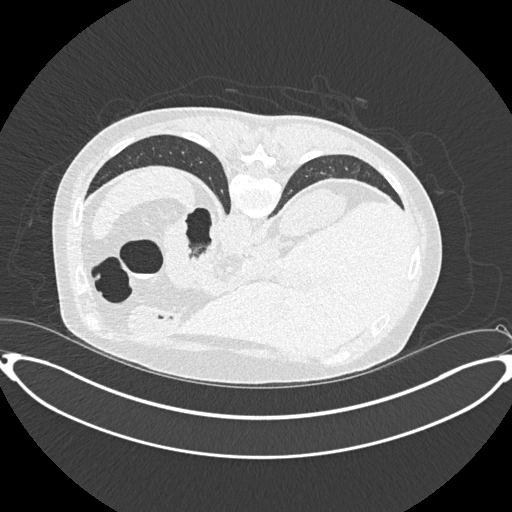
[im 308/343  soft-tissue]
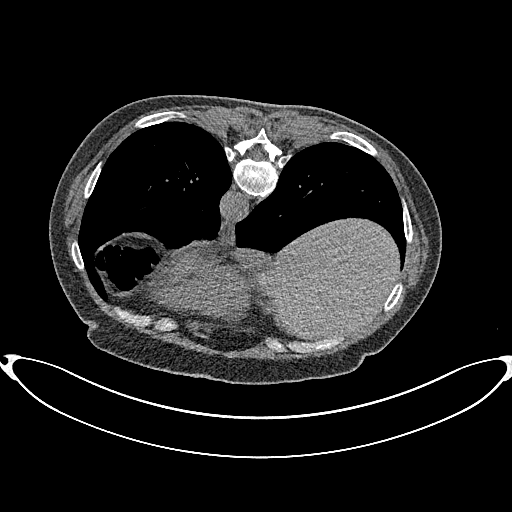
[im 308/343  lung]
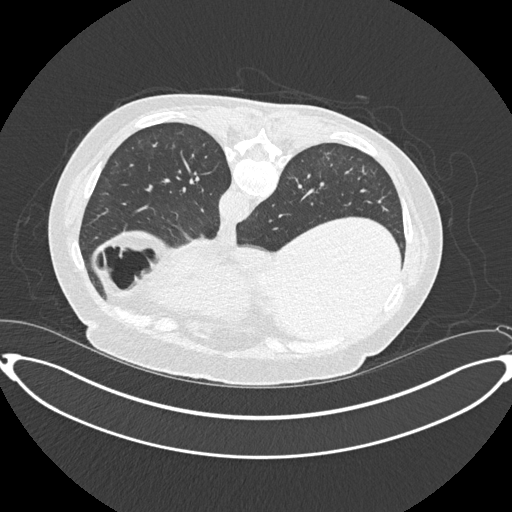
[im 308/343  bone]
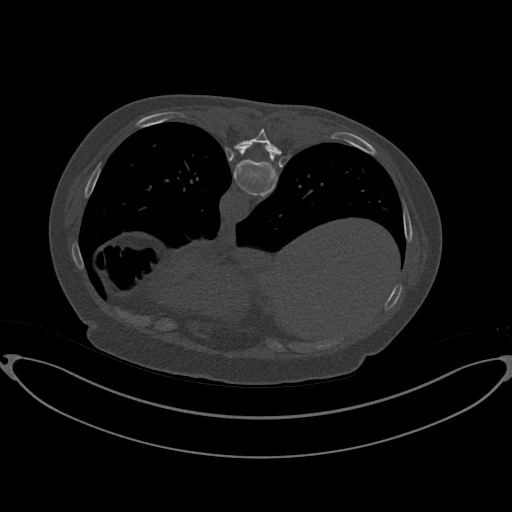

[12 of 46 positions shown; findings below may reference images not displayed]

FINDINGS: VIRTUAL COLONOSCOPY

Study is nondiagnostic in the sigmoid colon and distal descending
colon due to nondistention on both supine and prone imaging. Full
column barium retention also present in the distal descending colon.
Large amount of retained layering barium elsewhere throughout the
colon. No visible non barium tagged polypoid filling defects or
annular constricting lesions otherwise.

Virtual colonoscopy is not designed to detect diminutive polyps
(i.e., less than or equal to 5 mm), the presence or absence of which
may not affect clinical management.

CT ABDOMEN AND PELVIS WITHOUT CONTRAST

Lower chest: No acute abnormality

Hepatobiliary: No focal hepatic abnormality. Gallbladder
unremarkable.

Pancreas: No focal abnormality or ductal dilatation.

Spleen: No focal abnormality.  Normal size.

Adrenals/Urinary Tract: No adrenal abnormality. No focal renal
abnormality. No stones or hydronephrosis. Urinary bladder is
unremarkable.

Stomach/Bowel: Stomach and small bowel decompressed, grossly
unremarkable.

Vascular/Lymphatic: Aortic atherosclerosis. No evidence of aneurysm
or adenopathy.

Reproductive: No visible focal abnormality. The heart knee artifact
from left hip replacement obscures lower pelvic structures.

Other: No free fluid or free air.

Musculoskeletal: No acute bony abnormality. Prior left hip
replacement.
IMPRESSION: Suboptimal/nondiagnostic study in the sigmoid colon and distal
descending colon due to nondistention and barium retention.
Otherwise, no visible fixed non barium tagged polypoid filling
defects or annular constricting lesions.

Aortic atherosclerosis.

## 2024-05-15 ENCOUNTER — Encounter (HOSPITAL_COMMUNITY): Payer: Self-pay

## 2024-05-15 ENCOUNTER — Other Ambulatory Visit: Payer: Self-pay

## 2024-05-15 ENCOUNTER — Observation Stay (HOSPITAL_COMMUNITY)
Admission: EM | Admit: 2024-05-15 | Discharge: 2024-05-16 | DRG: 690 | Disposition: A | Discharge status: Home / Self Care | Attending: Internal Medicine | Admitting: Internal Medicine

## 2024-05-15 DIAGNOSIS — Z91048 Other nonmedicinal substance allergy status: Secondary | ICD-10-CM | POA: Diagnosis not present

## 2024-05-15 DIAGNOSIS — I959 Hypotension, unspecified: Secondary | ICD-10-CM | POA: Diagnosis not present

## 2024-05-15 DIAGNOSIS — Z885 Allergy status to narcotic agent status: Secondary | ICD-10-CM | POA: Diagnosis not present

## 2024-05-15 DIAGNOSIS — Z825 Family history of asthma and other chronic lower respiratory diseases: Secondary | ICD-10-CM

## 2024-05-15 DIAGNOSIS — Z87440 Personal history of urinary (tract) infections: Secondary | ICD-10-CM

## 2024-05-15 DIAGNOSIS — N3000 Acute cystitis without hematuria: Principal | ICD-10-CM

## 2024-05-15 DIAGNOSIS — Z79899 Other long term (current) drug therapy: Secondary | ICD-10-CM | POA: Diagnosis not present

## 2024-05-15 DIAGNOSIS — Z823 Family history of stroke: Secondary | ICD-10-CM | POA: Diagnosis not present

## 2024-05-15 DIAGNOSIS — E782 Mixed hyperlipidemia: Secondary | ICD-10-CM | POA: Diagnosis not present

## 2024-05-15 DIAGNOSIS — R32 Unspecified urinary incontinence: Secondary | ICD-10-CM | POA: Diagnosis present

## 2024-05-15 DIAGNOSIS — I10 Essential (primary) hypertension: Secondary | ICD-10-CM | POA: Diagnosis present

## 2024-05-15 DIAGNOSIS — E869 Volume depletion, unspecified: Secondary | ICD-10-CM | POA: Diagnosis present

## 2024-05-15 DIAGNOSIS — Z8249 Family history of ischemic heart disease and other diseases of the circulatory system: Secondary | ICD-10-CM | POA: Diagnosis not present

## 2024-05-15 DIAGNOSIS — E871 Hypo-osmolality and hyponatremia: Secondary | ICD-10-CM | POA: Diagnosis not present

## 2024-05-15 DIAGNOSIS — E876 Hypokalemia: Secondary | ICD-10-CM | POA: Diagnosis not present

## 2024-05-15 DIAGNOSIS — Z818 Family history of other mental and behavioral disorders: Secondary | ICD-10-CM

## 2024-05-15 DIAGNOSIS — Z888 Allergy status to other drugs, medicaments and biological substances status: Secondary | ICD-10-CM

## 2024-05-15 DIAGNOSIS — Z811 Family history of alcohol abuse and dependence: Secondary | ICD-10-CM

## 2024-05-15 DIAGNOSIS — N390 Urinary tract infection, site not specified: Principal | ICD-10-CM | POA: Diagnosis present

## 2024-05-15 DIAGNOSIS — Z83438 Family history of other disorder of lipoprotein metabolism and other lipidemia: Secondary | ICD-10-CM | POA: Diagnosis not present

## 2024-05-15 LAB — CBC WITH DIFFERENTIAL/PLATELET
Abs Immature Granulocytes: 0.1 K/uL — ABNORMAL HIGH (ref 0.00–0.07)
Basophils Absolute: 0 K/uL (ref 0.0–0.1)
Basophils Relative: 0 %
Eosinophils Absolute: 0.8 K/uL — ABNORMAL HIGH (ref 0.0–0.5)
Eosinophils Relative: 5 %
HCT: 32.5 % — ABNORMAL LOW (ref 36.0–46.0)
Hemoglobin: 10.7 g/dL — ABNORMAL LOW (ref 12.0–15.0)
Immature Granulocytes: 1 %
Lymphocytes Relative: 6 %
Lymphs Abs: 0.9 K/uL (ref 0.7–4.0)
MCH: 29.5 pg (ref 26.0–34.0)
MCHC: 32.9 g/dL (ref 30.0–36.0)
MCV: 89.5 fL (ref 80.0–100.0)
Monocytes Absolute: 0.4 K/uL (ref 0.1–1.0)
Monocytes Relative: 3 %
Neutro Abs: 13.3 K/uL — ABNORMAL HIGH (ref 1.7–7.7)
Neutrophils Relative %: 85 %
Platelets: 352 K/uL (ref 150–400)
RBC: 3.63 MIL/uL — ABNORMAL LOW (ref 3.87–5.11)
RDW: 13.3 % (ref 11.5–15.5)
WBC: 15.5 K/uL — ABNORMAL HIGH (ref 4.0–10.5)
nRBC: 0 % (ref 0.0–0.2)

## 2024-05-15 LAB — URINALYSIS, MICROSCOPIC (REFLEX)

## 2024-05-15 LAB — COMPREHENSIVE METABOLIC PANEL WITH GFR
ALT: 14 U/L (ref 0–44)
AST: 17 U/L (ref 15–41)
Albumin: 3.5 g/dL (ref 3.5–5.0)
Alkaline Phosphatase: 62 U/L (ref 38–126)
Anion gap: 13 (ref 5–15)
BUN: 14 mg/dL (ref 8–23)
CO2: 20 mmol/L — ABNORMAL LOW (ref 22–32)
Calcium: 8.1 mg/dL — ABNORMAL LOW (ref 8.9–10.3)
Chloride: 97 mmol/L — ABNORMAL LOW (ref 98–111)
Creatinine, Ser: 0.99 mg/dL (ref 0.44–1.00)
GFR, Estimated: >60 mL/min (ref >60)
Glucose, Bld: 179 mg/dL — ABNORMAL HIGH (ref 70–99)
Potassium: 2.8 mmol/L — ABNORMAL LOW (ref 3.5–5.1)
Sodium: 130 mmol/L — ABNORMAL LOW (ref 135–145)
Total Bilirubin: 0.7 mg/dL (ref 0.0–1.2)
Total Protein: 7.2 g/dL (ref 6.5–8.1)

## 2024-05-15 LAB — URINALYSIS, ROUTINE W REFLEX MICROSCOPIC
Bilirubin Urine: NEGATIVE
Glucose, UA: NEGATIVE mg/dL
Hgb urine dipstick: NEGATIVE
Ketones, ur: NEGATIVE mg/dL
Nitrite: POSITIVE — AB
Protein, ur: 100 mg/dL — AB
Specific Gravity, Urine: 1.015 (ref 1.005–1.030)
pH: 6 (ref 5.0–8.0)

## 2024-05-15 LAB — MAGNESIUM: Magnesium: 2 mg/dL (ref 1.7–2.4)

## 2024-05-15 LAB — LACTIC ACID, PLASMA
Lactic Acid, Venous: 1.3 mmol/L (ref 0.5–1.9)
Lactic Acid, Venous: 0.8 mmol/L (ref 0.5–1.9)

## 2024-05-15 LAB — TROPONIN I (HIGH SENSITIVITY)
Troponin I (High Sensitivity): 16 ng/L (ref <18)
Troponin I (High Sensitivity): 14 ng/L (ref <18)

## 2024-05-15 MED ORDER — ACETAMINOPHEN 325 MG PO TABS: 650.0000 mg | ORAL_TABLET | Freq: Four times a day (QID) | ORAL | Status: DC | PRN

## 2024-05-15 MED ORDER — ONDANSETRON HCL 4 MG PO TABS: 4.0000 mg | ORAL_TABLET | Freq: Four times a day (QID) | ORAL | Status: DC | PRN

## 2024-05-15 MED ORDER — ONDANSETRON HCL 4 MG/2ML IJ SOLN: 4.0000 mg | Freq: Four times a day (QID) | INTRAMUSCULAR | Status: DC | PRN

## 2024-05-15 MED ORDER — SODIUM CHLORIDE 0.9 % IV BOLUS
1000.0000 mL | Freq: Once | INTRAVENOUS | Status: AC
  Administered 2024-05-15: 1000 mL via INTRAVENOUS

## 2024-05-15 MED ORDER — POTASSIUM CHLORIDE 10 MEQ/100ML IV SOLN
10.0000 meq | INTRAVENOUS | Status: AC
  Administered 2024-05-15 (×2): 10 meq via INTRAVENOUS
  Filled 2024-05-15 (×2): qty 100

## 2024-05-15 MED ORDER — SODIUM CHLORIDE 0.9 % IV BOLUS
500.0000 mL | Freq: Once | INTRAVENOUS | Status: AC
  Administered 2024-05-15: 500 mL via INTRAVENOUS

## 2024-05-15 MED ORDER — CEFTRIAXONE SODIUM 1 G IJ SOLR: 1.0000 g | INTRAMUSCULAR | Status: DC

## 2024-05-15 MED ORDER — POLYETHYLENE GLYCOL 3350 17 G PO PACK: 17.0000 g | PACK | Freq: Every day | ORAL | Status: DC | PRN

## 2024-05-15 MED ORDER — ENOXAPARIN SODIUM 40 MG/0.4ML IJ SOSY
40.0000 mg | PREFILLED_SYRINGE | INTRAMUSCULAR | Status: DC
  Administered 2024-05-15: 40 mg via SUBCUTANEOUS
  Filled 2024-05-15: qty 0.4

## 2024-05-15 MED ORDER — ACETAMINOPHEN 650 MG RE SUPP: 650.0000 mg | Freq: Four times a day (QID) | RECTAL | Status: DC | PRN

## 2024-05-15 MED ORDER — POTASSIUM CHLORIDE CRYS ER 20 MEQ PO TBCR
40.0000 meq | EXTENDED_RELEASE_TABLET | Freq: Once | ORAL | Status: AC
  Administered 2024-05-15: 40 meq via ORAL
  Filled 2024-05-15: qty 2

## 2024-05-15 MED ORDER — SODIUM CHLORIDE 0.9 % IV SOLN
1.0000 g | Freq: Once | INTRAVENOUS | Status: AC
  Administered 2024-05-15: 1 g via INTRAVENOUS
  Filled 2024-05-15: qty 10

## 2024-05-15 MED ORDER — POTASSIUM CHLORIDE IN NACL 40-0.9 MEQ/L-% IV SOLN: INTRAVENOUS | Status: DC

## 2024-05-15 NOTE — H&P (Signed)
 History and Physical    Amy Cook FMW:[id number] DOB: 30-Oct-1955 DOA: 05/15/2024  PCP: Myra Geni ORN, FNP   Patient coming from: Home  I have personally briefly reviewed patient's old medical records in [facility]  Chief Complaint: Weakness, UTI symptoms  HPI: Amy Cook is a 68 y.o. female with medical history significant for hypertension.  Presented to the ED with complaints of ongoing pain with urination.  She reports frequent UTIs over the past 5 months.  She travels quite a bit, just over a week ago, she was in [place] , had UTI symptoms and was prescribed Bactrim, she was called later that the Bactrim was not going to work after urine cultures resulted, and she was switched over to Macrobid.  She completed 7 days.  Then a week ago she was in [place] , had UTI symptoms again, started taking Azo.  She got home a few days ago, and restarted macrobid ( from a previous prescription)  which she took for about 2 days. She has been scheduled to see a urologist, but her appointment is about a week out.  She reports persistent stinging pain with urination, no flank pain, no abdominal pain.  Over the past week she has had low blood pressure, dizziness.  Last blood pressure medication was about 2 days ago.  ED Course: Temperature 98.2.  Heart rate 77 - 92, respiratory 13-21, blood pressure systolic transiently down to 82/61 improved to 147/81. WBC of 15.5. Lactic acid 1.3 >> 0.8. Calcium 2.8, sodium 130.  UA suggestive of UTI. Magnesium of 2.  Urine cultures obtained.  IV ceftriaxone  started. 1.5 L bolus given.  Review of Systems: As per HPI all other systems reviewed and negative.  Past Medical History:  Diagnosis Date   Arthritis    Cataract 12/2016   both   Hypertension     Past Surgical History:  Procedure Laterality Date   ABDOMINAL HYSTERECTOMY  2001   complete   CESAREAN SECTION     FLEXIBLE SIGMOIDOSCOPY N/A 08/21/2021   Procedure: FLEXIBLE SIGMOIDOSCOPY;   Surgeon: Golda Claudis PENNER, MD;  Location: [facility];  Service: Endoscopy;  Laterality: N/A;     reports that she has never smoked. She has never used smokeless tobacco. She reports that she does not drink alcohol and does not use drugs.  Allergies  Allergen Reactions   Wound Dressing Adhesive Dermatitis and Rash    adhesive   Morphine And Codeine Itching   Camph-Eucalypt-Men-Turp-Pet Dermatitis    Family History  Problem Relation Age of Onset   Depression Mother    Hypertension Mother    Hyperlipidemia Mother    Stroke Mother    Alcohol abuse Father    Heart disease Father    Hypertension Father    Hyperlipidemia Father    COPD Sister    Depression Sister    Alcohol abuse Brother    Depression Brother    Alcohol abuse Brother    Depression Brother    Depression Daughter    Alcohol abuse Maternal Uncle    Alcohol abuse Paternal Uncle    Colon cancer Neg Hx    Prior to Admission medications   Medication Sig Start Date End Date Taking? Authorizing Provider  Cholecalciferol (DIALYVITE VITAMIN D 5000 PO) Take 1 tablet by mouth every morning.   Yes [provider]  hydrocortisone 2.5 % ointment APPLY OINTMENT EXTERNALLY TWICE DAILY   Yes [provider]  melatonin 3 MG TABS tablet    Yes  [provider]  Omega-3 1000 MG CAPS Take 1 g by mouth.   Yes [provider]  phenazopyridine (PYRIDIUM) 200 MG tablet Take by mouth.   Yes [provider]  Phenazopyridine HCl (AZO TABS PO) Take 1 tablet by mouth daily.   Yes [provider]  acetaminophen  (TYLENOL ) 500 MG tablet Take 500 mg by mouth every 8 (eight) hours as needed for moderate pain. Patient not taking: Reported on 05/15/2024    [provider]  atorvastatin (LIPITOR) 10 MG tablet Take 1 tablet by mouth daily. Patient not taking: Reported on 05/15/2024 06/17/23 06/16/24  [provider]  fluconazole  (DIFLUCAN ) 150 MG tablet Take 150 mg by mouth  once. Patient not taking: Reported on 05/15/2024 04/21/24   [provider]  lisinopril-hydrochlorothiazide (ZESTORETIC) 20-25 MG tablet Take 1 tablet by mouth daily. Patient not taking: Reported on 05/15/2024 07/29/21   [provider]  nitrofurantoin, macrocrystal-monohydrate, (MACROBID) 100 MG capsule Take 100 mg by mouth 2 (two) times daily. Patient not taking: Reported on 05/15/2024 04/24/24   [provider]    Physical Exam: Vitals:   05/15/24 1330 05/15/24 1400 05/15/24 1530 05/15/24 1615  BP: 105/78 100/66 114/77 123/83  Pulse: 80   80  Resp: 18   (!) 21  Temp:      SpO2: 94%   96%  Weight:      Height:        Constitutional: NAD, calm, comfortable Vitals:   05/15/24 1330 05/15/24 1400 05/15/24 1530 05/15/24 1615  BP: 105/78 100/66 114/77 123/83  Pulse: 80   80  Resp: 18   (!) 21  Temp:      SpO2: 94%   96%  Weight:      Height:       Eyes: PERRL, lids and conjunctivae normal ENMT: Mucous membranes are moist. Posterior pharynx clear of any exudate or lesions. Respiratory: clear to auscultation bilaterally, no wheezing, no crackles. Normal respiratory effort. No accessory muscle use.  Cardiovascular: Regular rate and rhythm, no murmurs / rubs / gallops. No extremity edema. Abdomen: no tenderness, no masses palpated. No hepatosplenomegaly.   Musculoskeletal: no clubbing / cyanosis. No joint deformity upper and lower extremities. Skin: no rashes, lesions, ulcers. No induration Neurologic: No facial asymmetry, moving extremity spontaneously, speech fluent.  Psychiatric: Normal judgment and insight. Alert and oriented x 3. Normal mood.   Labs on Admission: I have personally reviewed following labs and imaging studies  CBC: Recent Labs  Lab 05/15/24 1034  WBC 15.5*  NEUTROABS 13.3*  HGB 10.7*  HCT 32.5*  MCV 89.5  PLT 352   Basic Metabolic Panel: Recent Labs  Lab 05/15/24 1034 05/15/24 1342  NA 130*  --   K 2.8*  --   CL 97*  --    CO2 20*  --   GLUCOSE 179*  --   BUN 14  --   CREATININE 0.99  --   CALCIUM 8.1*  --   MG  --  2.0   GFR: Estimated Creatinine Clearance: 52.2 mL/min (by C-G formula based on SCr of 0.99 mg/dL). Liver Function Tests: Recent Labs  Lab 05/15/24 1034  AST 17  ALT 14  ALKPHOS 62  BILITOT 0.7  PROT 7.2  ALBUMIN 3.5   Urine analysis:    Component Value Date/Time   COLORURINE YELLOW 05/15/2024 1120   APPEARANCEUR HAZY (A) 05/15/2024 1120   LABSPEC 1.015 05/15/2024 1120   PHURINE 6.0 05/15/2024 1120   GLUCOSEU NEGATIVE 05/15/2024  1120   HGBUR NEGATIVE 05/15/2024 1120   BILIRUBINUR NEGATIVE 05/15/2024 1120   KETONESUR NEGATIVE 05/15/2024 1120   PROTEINUR 100 (A) 05/15/2024 1120   NITRITE POSITIVE (A) 05/15/2024 1120   LEUKOCYTESUR MODERATE (A) 05/15/2024 1120    Radiological Exams on Admission: No results found.  EKG: Independently reviewed.  Sinus rhythm, rate 93, QTc 460.  T wave inversions in lead to 3 aVF V3 through V6.  EKG on file.  Assessment/Plan Principal Problem:   Complicated UTI (urinary tract infection) Active Problems:   Hypertension   Hypokalemia   Assessment and Plan: * Complicated UTI (urinary tract infection) Frequent/recurrent UTI.  Failed outpatient management.  Leukocytosis of 15.5, soft blood pressure improved with 1.5 L bolus fluids.  Normal lactic acid 1.3> 0.8. Recent urine culture 04/21/2024 per Care Everywhere -resistance to Bactrim, Unasyn and cefazolin otherwise pansensitive. -IV ceftriaxone  - continue N/s + 40 Kcl 100cc/hr x 15hrs -She has an upcoming appointment with urology and the wise she may benefit from chronic suppression therapy - Follow-up blood and urine cultures  Hypokalemia Potassium 2.8.  Magnesium 2.  - replete k.  Hypertension Blood pressure soft.  Transiently down to 82/61.  - Hold lisinopril-hydrochlorothiazide - Hydrate  Hyponatremia sodium 130, last sodium on file- 5 years ago sodium was 140. - hydrate   DVT  prophylaxis: Lovenox  Code Status: FULL Family Communication: Spouse at bedside Disposition Plan: ~ 2 days Consults called: None  Admission status:  Inpt Tele  I certify that at the point of admission it is my clinical judgment that the patient will require inpatient hospital care spanning beyond 2 midnights from the point of admission due to high intensity of service, high risk for further deterioration and high frequency of surveillance required.   Author: Tully FORBES Carwin, MD 05/15/2024 6:03 PM  For on call review [email or web address].

## 2024-05-15 NOTE — Assessment & Plan Note (Addendum)
 Frequent/recurrent UTI.  Failed outpatient management.  Leukocytosis of 15.5, soft blood pressure improved with 1.5 L bolus fluids.  Normal lactic acid 1.3> 0.8. Recent urine culture 04/21/2024 per Care Everywhere -resistance to Bactrim, Unasyn and cefazolin otherwise pansensitive. -IV ceftriaxone  - continue N/s + 40 Kcl 100cc/hr x 15hrs -She has an upcoming appointment with urology and the wise she may benefit from chronic suppression therapy - Follow-up blood and urine cultures

## 2024-05-15 NOTE — ED Provider Notes (Cosign Needed Addendum)
 [place] [facility]MENT AT [facility] Provider Note   CSN: [id number] Arrival date & time: 05/15/24  1006     Patient presents with: Weakness   Amy Cook is a 68 y.o. female.  She presents ER today with generalized weakness as well as dysuria.  She states for the past 5 months she has been having frequent UTIs.  She has been to multiple different providers because she travels a lot.  Most recently she had burning with urination and was prescribed Bactrim, but was called back with culture results stating that that was not going to work and they represcribed Macrobid.  This is approximately 3 weeks ago.  She had some of the Macrobid leftover.  She started having burning with urination again several days ago, she took Azo with mild relief but when it persisted she started taking the Macrobid she had leftover.  She has taken a total of 3 doses.  She states she over the past couple weeks has been feeling very lightheaded and generally weak.  She had to stop taking her blood pressure medication because her blood pressure was running low.  Today she is feeling very lightheaded, having off-and-on chills and wanted to be evaluated further for this in the setting of UTI.    Weakness      Prior to Admission medications   Medication Sig Start Date End Date Taking? Authorizing Provider  Cholecalciferol (DIALYVITE VITAMIN D 5000 PO) Take 1 tablet by mouth every morning.   Yes [provider]  hydrocortisone 2.5 % ointment APPLY OINTMENT EXTERNALLY TWICE DAILY   Yes [provider]  melatonin 3 MG TABS tablet    Yes [provider]  Omega-3 1000 MG CAPS Take 1 g by mouth.   Yes [provider]  phenazopyridine (PYRIDIUM) 200 MG tablet Take by mouth.   Yes [provider]  Phenazopyridine HCl (AZO TABS PO) Take 1 tablet by mouth daily.   Yes [provider]  acetaminophen  (TYLENOL ) 500 MG tablet Take 500 mg by mouth every 8  (eight) hours as needed for moderate pain. Patient not taking: Reported on 05/15/2024    [provider]  atorvastatin (LIPITOR) 10 MG tablet Take 1 tablet by mouth daily. Patient not taking: Reported on 05/15/2024 06/17/23 06/16/24  [provider]  fluconazole  (DIFLUCAN ) 150 MG tablet Take 150 mg by mouth once. Patient not taking: Reported on 05/15/2024 04/21/24   [provider]  lisinopril-hydrochlorothiazide (ZESTORETIC) 20-25 MG tablet Take 1 tablet by mouth daily. Patient not taking: Reported on 05/15/2024 07/29/21   [provider]  nitrofurantoin, macrocrystal-monohydrate, (MACROBID) 100 MG capsule Take 100 mg by mouth 2 (two) times daily. Patient not taking: Reported on 05/15/2024 04/24/24   [provider]    Allergies: Wound dressing adhesive, Morphine and codeine, and Camph-eucalypt-men-turp-pet    Review of Systems  Neurological:  Positive for weakness.    Updated Vital Signs BP 123/83   Pulse 80   Temp 98.2 F (36.8 C)   Resp (!) 21   Ht 5' 4 (1.626 m)   Wt 72.1 kg   SpO2 96%   BMI 27.28 kg/m   Physical Exam Vitals and nursing note reviewed.  Constitutional:      General: She is not in acute distress.    Appearance: She is well-developed.  HENT:     Head: Normocephalic and atraumatic.     Right Ear: Tympanic membrane normal.     Left Ear: Tympanic membrane  normal.     Mouth/Throat:     Mouth: Mucous membranes are moist.  Eyes:     Extraocular Movements: Extraocular movements intact.     Conjunctiva/sclera: Conjunctivae normal.     Pupils: Pupils are equal, round, and reactive to light.  Cardiovascular:     Rate and Rhythm: Normal rate and regular rhythm.     Heart sounds: No murmur heard. Pulmonary:     Effort: Pulmonary effort is normal. No respiratory distress.     Breath sounds: Normal breath sounds.  Abdominal:     Palpations: Abdomen is soft.     Tenderness: There is no abdominal tenderness. There is no  right CVA tenderness, left CVA tenderness, guarding or rebound.  Musculoskeletal:        General: No swelling.     Cervical back: Neck supple.  Skin:    General: Skin is warm and dry.     Capillary Refill: Capillary refill takes less than 2 seconds.  Neurological:     General: No focal deficit present.     Mental Status: She is alert and oriented to person, place, and time.     Sensory: No sensory deficit.     Motor: No weakness.     Gait: Gait normal.  Psychiatric:        Mood and Affect: Mood normal.     (all labs ordered are listed, but only abnormal results are displayed) Labs Reviewed  URINALYSIS, ROUTINE W REFLEX MICROSCOPIC - Abnormal; Notable for the following components:      Result Value   APPearance HAZY (*)    Protein, ur 100 (*)    Nitrite POSITIVE (*)    Leukocytes,Ua MODERATE (*)    All other components within normal limits  COMPREHENSIVE METABOLIC PANEL WITH GFR - Abnormal; Notable for the following components:   Sodium 130 (*)    Potassium 2.8 (*)    Chloride 97 (*)    CO2 20 (*)    Glucose, Bld 179 (*)    Calcium 8.1 (*)    All other components within normal limits  CBC WITH DIFFERENTIAL/PLATELET - Abnormal; Notable for the following components:   WBC 15.5 (*)    RBC 3.63 (*)    Hemoglobin 10.7 (*)    HCT 32.5 (*)    Neutro Abs 13.3 (*)    Eosinophils Absolute 0.8 (*)    Abs Immature Granulocytes 0.10 (*)    All other components within normal limits  URINALYSIS, MICROSCOPIC (REFLEX) - Abnormal; Notable for the following components:   Bacteria, UA FEW (*)    All other components within normal limits  CULTURE, BLOOD (ROUTINE X 2)  CULTURE, BLOOD (ROUTINE X 2)  URINE CULTURE  LACTIC ACID, PLASMA  LACTIC ACID, PLASMA  MAGNESIUM  TROPONIN I (HIGH SENSITIVITY)  TROPONIN I (HIGH SENSITIVITY)    EKG: EKG Interpretation Date/Time:  Monday May 15 2024 11:00:35 EDT Ventricular Rate:  93 PR Interval:  168 QRS Duration:  104 QT  Interval:  370 QTC Calculation: 460 R Axis:   58  Text Interpretation: Sinus rhythm Non-specific ST-t changes Confirmed by Bernard Drivers ([id number]) on 05/15/2024 1:58:37 PM  Radiology: No results found.   Procedures   Medications Ordered in the ED  potassium chloride  10 mEq in 100 mL IVPB (10 mEq Intravenous New Bag/Given 05/15/24 1609)  sodium chloride  0.9 % bolus 1,000 mL (0 mLs Intravenous Stopped 05/15/24 1318)  potassium chloride  SA (KLOR-CON  M) CR tablet 40 mEq (40 mEq Oral Given  05/15/24 1330)  cefTRIAXone  (ROCEPHIN ) 1 g in sodium chloride  0.9 % 100 mL IVPB (0 g Intravenous Stopped 05/15/24 1430)  sodium chloride  0.9 % bolus 500 mL (500 mLs Intravenous New Bag/Given 05/15/24 1506)                                    Medical Decision Making This patient presents to the ED for concern of CBC with white blood count of 15.5, hemoglobin 10.7, this involves an extensive number of treatment options, and is a complaint that carries with it a high risk of complications and morbidity.  The differential diagnosis includes UTI, sepsis, dehydration, electrolyte disturbance, kidney stone, other.   Co morbidities that complicate the patient evaluation :   Hypertension   Additional history obtained:  Additional history obtained from EMR External records from outside source obtained and reviewed including prior notes and labs   Lab Tests:  I Ordered, and personally interpreted labs.  The pertinent results include: Leukocytosis with white count 15.5, mild hyponatremia sodium 130, potassium low at 2.8, lactic acid normal, troponin negative x 2, magnesium normal, UA with positive nitrate, moderate leukocytes, 6-10 white blood cells per high-power field and few bacteria    Cardiac Monitoring: / EKG:  The patient was maintained on a cardiac monitor.  I personally viewed and interpreted the cardiac monitored which showed an underlying rhythm of: Sinus rhythm, patient had inferior and lateral T wave  inversions on EKG though it has been many years since her previous EKG.   Consultations Obtained:  I requested consultation with the hospitalist Dr. FORBES Carwin,  and discussed lab and imaging findings as well as pertinent plan - they recommend: Admission   Problem List / ED Course / Critical interventions / Medication management  Generalized weakness, dizziness, dysuria-ongoing for a while now worse in the past several days.  She had recent treatment several weeks ago for UTI, did not finish her Macrobid and had some leftover, she has had 3 doses of this over the past 2 days.  She still feeling unwell feeling lightheaded, had a blood pressure 80 over 60s systolic.  This is without having taken her blood pressure medicine but she has been off of it for the past week at due to low blood pressures.  She states her blood pressure while on medication is usually in the 120s to 130s systolic, she had to stop this due to low blood pressures.  She has had decreased intake due to lack of appetite. Given patient's low blood pressure and electrolyte abnormalities she was given IV fluids and potassium, she is feeling still unwell.  Blood pressure has improved somewhat.  Will plan for admission.  Will treat UTI with Rocephin  based on recent culture results from outside hospital. As patient has started feeling better we had another discussion about risks and benefits of discharge versus admission and at this time she would still like to stay.  Troponins were ordered as patient had abnormal EKG.  These were negative x 2 and she is having no chest pain or shortness of breath.  Discussed with patient that EKG was abnormal and would likely need follow-up but does not seem to be related to her complaints today. Reevaluation of the patient after these medicines showed that the patient improved I have reviewed the patients home medicines and have made adjustments as needed   Social Determinants of Health:  Lives  independently,  travels frequently       Amount and/or Complexity of Data Reviewed Labs: ordered. ECG/medicine tests: ordered.  Risk Prescription drug management. Decision regarding hospitalization.        Final diagnoses:  None    ED Discharge Orders     None          Suellen Sherran DELENA DEVONNA 05/15/24 1632    Suellen Sherran DELENA DEVONNA 05/15/24 1636    Bernard Drivers, MD 05/16/24 [phone]

## 2024-05-15 NOTE — Assessment & Plan Note (Signed)
 Potassium 2.8.  Magnesium 2.  - replete k.

## 2024-05-15 NOTE — ED Notes (Signed)
 Retook pts vitals for reassurance, EKG completed. Pt aware we need urine sample. Pt has been MSE by PA.

## 2024-05-15 NOTE — ED Triage Notes (Signed)
 Pt c/o weakness for over months. Pt states hypotension BPs at home. Pt just states feeling weaker and weaker. Pt has painful urination. Pt has not been taken her hypertension medications since starting symptoms.

## 2024-05-15 NOTE — ED Provider Triage Note (Signed)
 Emergency Medicine Provider Triage Evaluation Note  Amy Cook , a 68 y.o. female  was evaluated in triage.  Pt complains of UTIs x 5 months, this episode started 2 days ago, has been on Bactrim which she had leftover x 3 doses.  Also taking Azo.  Complaining today of chills, lightheadedness and feeling overall generally weak..  Review of Systems  Positive: Chills, nausea, dysuria Negative: Fever, abdominal pain, back pain, vomiting  Physical Exam  BP 92/62 (BP Location: Right Arm)   Pulse 92   Temp 98.2 F (36.8 C)   Resp 18   Ht 5' 4 (1.626 m)   Wt 72.1 kg   SpO2 93%   BMI 27.28 kg/m  Gen:   Awake, no distress   Resp:  Normal effort  MSK:   Moves extremities without difficulty  Other:  Abdomen soft nontender x 4, no CVA tenderness.  Medical Decision Making  Medically screening exam initiated at 10:37 AM.  Appropriate orders placed.  Amy Cook was informed that the remainder of the evaluation will be completed by another provider, this initial triage assessment does not replace that evaluation, and the importance of remaining in the ED until their evaluation is complete.     Amy Sherran LABOR, PA-C 05/15/24 1038

## 2024-05-15 NOTE — Assessment & Plan Note (Signed)
 Blood pressure soft.  Transiently down to 82/61.  - Hold lisinopril-hydrochlorothiazide - Hydrate

## 2024-05-15 NOTE — ED Notes (Signed)
 Transporter called to transport patient to 300

## 2024-05-16 DIAGNOSIS — N3000 Acute cystitis without hematuria: Principal | ICD-10-CM

## 2024-05-16 DIAGNOSIS — E876 Hypokalemia: Secondary | ICD-10-CM | POA: Diagnosis not present

## 2024-05-16 DIAGNOSIS — N390 Urinary tract infection, site not specified: Secondary | ICD-10-CM | POA: Diagnosis not present

## 2024-05-16 LAB — CBC
HCT: 32.2 % — ABNORMAL LOW (ref 36.0–46.0)
Hemoglobin: 10.2 g/dL — ABNORMAL LOW (ref 12.0–15.0)
MCH: 28.8 pg (ref 26.0–34.0)
MCHC: 31.7 g/dL (ref 30.0–36.0)
MCV: 91 fL (ref 80.0–100.0)
Platelets: 289 K/uL (ref 150–400)
RBC: 3.54 MIL/uL — ABNORMAL LOW (ref 3.87–5.11)
RDW: 13.2 % (ref 11.5–15.5)
WBC: 6.3 K/uL (ref 4.0–10.5)
nRBC: 0 % (ref 0.0–0.2)

## 2024-05-16 LAB — URINE CULTURE

## 2024-05-16 LAB — BASIC METABOLIC PANEL WITH GFR
Anion gap: 7 (ref 5–15)
BUN: 9 mg/dL (ref 8–23)
CO2: 23 mmol/L (ref 22–32)
Calcium: 8 mg/dL — ABNORMAL LOW (ref 8.9–10.3)
Chloride: 109 mmol/L (ref 98–111)
Creatinine, Ser: 0.75 mg/dL (ref 0.44–1.00)
GFR, Estimated: >60 mL/min (ref >60)
Glucose, Bld: 97 mg/dL (ref 70–99)
Potassium: 3.8 mmol/L (ref 3.5–5.1)
Sodium: 139 mmol/L (ref 135–145)

## 2024-05-16 LAB — HIV ANTIBODY (ROUTINE TESTING W REFLEX): HIV Screen 4th Generation wRfx: NONREACTIVE

## 2024-05-16 MED ORDER — FOSFOMYCIN TROMETHAMINE 3 G PO PACK: 3.0000 g | PACK | Freq: Once | ORAL | 0 refills | Status: AC

## 2024-05-16 MED ORDER — FOSFOMYCIN TROMETHAMINE 3 G PO PACK
3.0000 g | PACK | Freq: Once | ORAL | Status: AC
  Administered 2024-05-16: 3 g via ORAL
  Filled 2024-05-16: qty 3

## 2024-05-16 MED ORDER — VITAMIN C 500 MG PO TABS
1000.0000 mg | ORAL_TABLET | Freq: Every day | ORAL | Status: DC
Start: 2024-05-16 — End: 2024-05-16
  Administered 2024-05-16: 1000 mg via ORAL
  Filled 2024-05-16: qty 2

## 2024-05-16 MED ORDER — POTASSIUM CHLORIDE IN NACL 20-0.9 MEQ/L-% IV SOLN: INTRAVENOUS | Status: DC

## 2024-05-16 MED ORDER — SODIUM CHLORIDE 0.9 % IV SOLN
1.0000 g | INTRAVENOUS | Status: DC
Start: 2024-05-16 — End: 2024-05-16
  Administered 2024-05-16: 1 g via INTRAVENOUS
  Filled 2024-05-16: qty 10

## 2024-05-16 MED ORDER — ASCORBIC ACID 1000 MG PO TABS: 1000.0000 mg | ORAL_TABLET | Freq: Every day | ORAL | Status: AC

## 2024-05-16 MED ORDER — METHENAMINE HIPPURATE 1 G PO TABS: 1.0000 g | ORAL_TABLET | Freq: Two times a day (BID) | ORAL | 0 refills | Status: AC

## 2024-05-16 NOTE — Hospital Course (Signed)
 68 year old female with history of hypertension, hyperlipidemia, and frequent UTIs presenting with 2 to 3-day history of worsening dysuria, dizziness, and generalized weakness. The patient states that her most recent episode of UTI and dysuria started at the end of August when she was visiting family in [place] .  She went to urgent care on 04/21/2024.  She was prescribed Bactrim empirically.  After she returned to [place] , she was contacted that the organism was resistant to Bactrim, and the patient was switched to nitrofurantoin.  She had symptomatic relief during the course of antibiotics.  Unfortunately the patient began developing dysuria once again when she was visiting [place]  around 05/06/2024.  She took some over-the-counter Pyridium which helped temporarily.  Unfortunately, her dysuria returned with some subjective fevers and chills and nausea and dizziness.  As result she presented for further evaluation and treatment.  She states that she has had at least 7 episodes of UTIs in the past calendar year.  She states that she has had 3-4 antibiotic courses.  She states that she has had a history of postcoital UTIs in the past for which she has intermittently taken antibiotic prophylaxis postcoital. She does endorse urinary incontinence for which she states that she has had to use pee pads.    In the ED, the patient was afebrile, but was initially hypotensive with a blood pressure of 82/61.  She was saturating 96% room air.  WBC 15.5, hemoglobin 10.7, platelets 352.  Sodium 130, potassium 2.8, bicarbonate 20, serum creatinine 0.99.  LFTs unremarkable.  UA 6-10 WBC.  Lactic acid 1.3>> 0.8.  The patient was started IV ceftriaxone  and IV fluids.  The patient improved symptomatically.  WBC improved from 15.5-6.3.  She remained hemodynamically stable.  The patient will be discharged home with oral antibiotics.  He has a follow-up appoint with urology on 05/24/2024.

## 2024-05-16 NOTE — Plan of Care (Signed)

## 2024-05-16 NOTE — Progress Notes (Signed)
   05/16/24 1130  TOC Brief Assessment  Insurance and Status Reviewed  Patient has primary care physician Yes  Home environment has been reviewed Home  Prior level of function: With Spouse  Prior/Current Home Services No current home services  Social Drivers of Health Review SDOH reviewed no interventions necessary  Readmission risk has been reviewed Yes  Transition of care needs no transition of care needs at this time   Transition of Care Department [facility]) has reviewed patient and no TOC needs have been identified at this time. We will continue to monitor patient advancement through interdisciplinary progression rounds. If new patient transition needs arise, please place a TOC consult.

## 2024-05-16 NOTE — Discharge Summary (Signed)
 Physician Discharge Summary   Patient: Amy Cook MRN: [id number] DOB: [birth year]  Admit date:     05/15/2024  Discharge date: 05/16/24  Discharge Physician: Alm Hadasah Brugger   PCP: Myra Geni ORN, FNP   Recommendations at discharge:  { Please follow up with primary care provider within 1-2 weeks  Please repeat BMP and CBC in one week    Hospital Course: 68 year old female with history of hypertension, hyperlipidemia, and frequent UTIs presenting with 2 to 3-day history of worsening dysuria, dizziness, and generalized weakness. The patient states that her most recent episode of UTI and dysuria started at the end of August when she was visiting family in [place] .  She went to urgent care on 04/21/2024.  She was prescribed Bactrim empirically.  After she returned to [place] , she was contacted that the organism was resistant to Bactrim, and the patient was switched to nitrofurantoin.  She had symptomatic relief during the course of antibiotics.  Unfortunately the patient began developing dysuria once again when she was visiting [place]  around 05/06/2024.  She took some over-the-counter Pyridium which helped temporarily.  Unfortunately, her dysuria returned with some subjective fevers and chills and nausea and dizziness.  As result she presented for further evaluation and treatment.  She states that she has had at least 7 episodes of UTIs in the past calendar year.  She states that she has had 3-4 antibiotic courses.  She states that she has had a history of postcoital UTIs in the past for which she has intermittently taken antibiotic prophylaxis postcoital. She does endorse urinary incontinence for which she states that she has had to use pee pads.    In the ED, the patient was afebrile, but was initially hypotensive with a blood pressure of 82/61.  She was saturating 96% room air.  WBC 15.5, hemoglobin 10.7, platelets 352.  Sodium 130, potassium 2.8, bicarbonate 20, serum creatinine 0.99.   LFTs unremarkable.  UA 6-10 WBC.  Lactic acid 1.3>> 0.8.  The patient was started IV ceftriaxone  and IV fluids.  The patient improved symptomatically.  WBC improved from 15.5-6.3.  She remained hemodynamically stable.  The patient will be discharged home with oral antibiotics.  He has a follow-up appoint with urology on 05/24/2024.  Assessment and Plan: Complicated UTI -Final urine culture is pending, but the patient is symptomatically better on ceftriaxone --received 2 doses in hospital - Discharge home on fosfomycin x 1 dose based upon previous susceptibility on 04/21/2024 - Blood culture negative at the time of discharge - We discussed good personal hygiene and minimizing her pee pads as a means to help decrease UTIs - We discussed the risks, benefits, and alternatives of antibiotic and on antibiotic strategies - Given the risks of development of resistance on chronic antibiotics, the patient would favor trying nonantibiotic strategies - Discussed vitamin C  and methenamine  which pt is willing to try  Essential hypertension - Holding lisinopril hydrochlorothiazide - BP well-controlled - Follow-up PCP  Mixed hyperlipidemia - She has documented statin intolerance - Her cardiologist had recommended Repatha  Hyponatremia - Secondary to volume depletion - Improved with IV fluids       Consultants: none Procedures performed: none  Disposition: Home Diet recommendation:  Cardiac diet DISCHARGE MEDICATION: Allergies as of 05/16/2024       Reactions   Wound Dressing Adhesive Dermatitis, Rash   adhesive   Morphine And Codeine Itching   Camph-eucalypt-men-turp-pet Dermatitis        Medication List     STOP taking  these medications    acetaminophen  500 MG tablet Commonly known as: TYLENOL    atorvastatin 10 MG tablet Commonly known as: LIPITOR   fluconazole  150 MG tablet Commonly known as: DIFLUCAN    lisinopril-hydrochlorothiazide 20-25 MG tablet Commonly known as:  ZESTORETIC   nitrofurantoin (macrocrystal-monohydrate) 100 MG capsule Commonly known as: MACROBID       TAKE these medications    ascorbic acid  1000 MG tablet Commonly known as: VITAMIN C  Take 1 tablet (1,000 mg total) by mouth daily. Start taking on: May 17, 2024   AZO TABS PO Take 1 tablet by mouth daily.   phenazopyridine 200 MG tablet Commonly known as: PYRIDIUM Take by mouth.   DIALYVITE VITAMIN D 5000 PO Take 1 tablet by mouth every morning.   fosfomycin 3 g Pack Commonly known as: MONUROL  Take 3 g by mouth once for 1 dose. Start taking on: May 18, 2024   hydrocortisone 2.5 % ointment APPLY OINTMENT EXTERNALLY TWICE DAILY   melatonin 3 MG Tabs tablet   methenamine  1 g tablet Commonly known as: HIPREX  Take 1 tablet (1 g total) by mouth 2 (two) times daily with a meal. Start taking on: May 18, 2024   Omega-3 1000 MG Caps Take 1 g by mouth.        Discharge Exam: Filed Weights   05/15/24 1022 05/15/24 1843  Weight: 72.1 kg 74.6 kg   HEENT:  [place]/AT, No thrush, no icterus CV:  RRR, no rub, no S3, no S4 Lung:  CTA, no wheeze, no rhonchi Abd:  soft/+BS, NT Ext:  No edema, no lymphangitis, no synovitis, no rash   Condition at discharge: stable  The results of significant diagnostics from this hospitalization (including imaging, microbiology, ancillary and laboratory) are listed below for reference.   Imaging Studies: No results found.  Microbiology: Results for orders placed or performed during the hospital encounter of 05/15/24  Urine Culture     Status: Abnormal   Collection Time: 05/15/24 11:20 AM   Specimen: Urine, Clean Catch  Result Value Ref Range Status   Specimen Description   Final    URINE, CLEAN CATCH Performed at [facility], [address] [place], [place] [address]    Special Requests   Final    NONE Performed at [facility], [address], [place], [place] [address]    Culture MULTIPLE SPECIES PRESENT,  SUGGEST RECOLLECTION (A)  Final   Report Status 05/16/2024 FINAL  Final  Blood culture (routine x 2)     Status: None (Preliminary result)   Collection Time: 05/15/24 11:24 AM   Specimen: BLOOD  Result Value Ref Range Status   Specimen Description BLOOD BLOOD LEFT ARM  Final   Special Requests   Final    BOTTLES DRAWN AEROBIC AND ANAEROBIC Blood Culture adequate volume   Culture   Final    NO GROWTH < 24 HOURS Performed at [facility], [address], [place], [place] [address]    Report Status PENDING  Incomplete  Blood culture (routine x 2)     Status: None (Preliminary result)   Collection Time: 05/15/24 11:29 AM   Specimen: BLOOD  Result Value Ref Range Status   Specimen Description BLOOD BLOOD RIGHT HAND  Final   Special Requests   Final    BOTTLES DRAWN AEROBIC ONLY Blood Culture results may not be optimal due to an inadequate volume of blood received in culture bottles   Culture   Final    NO GROWTH < 24 HOURS  Performed at [facility], [address] [place], [place] [address]    Report Status PENDING  Incomplete    Labs: CBC: Recent Labs  Lab 05/15/24 1034 05/16/24 0346  WBC 15.5* 6.3  NEUTROABS 13.3*  --   HGB 10.7* 10.2*  HCT 32.5* 32.2*  MCV 89.5 91.0  PLT 352 289   Basic Metabolic Panel: Recent Labs  Lab 05/15/24 1034 05/15/24 1342 05/16/24 0346  NA 130*  --  139  K 2.8*  --  3.8  CL 97*  --  109  CO2 20*  --  23  GLUCOSE 179*  --  97  BUN 14  --  9  CREATININE 0.99  --  0.75  CALCIUM 8.1*  --  8.0*  MG  --  2.0  --    Liver Function Tests: Recent Labs  Lab 05/15/24 1034  AST 17  ALT 14  ALKPHOS 62  BILITOT 0.7  PROT 7.2  ALBUMIN 3.5   CBG: No results for input(s): GLUCAP in the last 168 hours.  Discharge time spent: greater than 30 minutes.  Signed: Alm Schneider, MD [facility] 05/16/2024

## 2024-05-16 NOTE — Progress Notes (Cosign Needed)
 In bed, resting, alert and oriented x4. No complaints voiced. Bed in low position. Call bell in place.

## 2024-05-17 ENCOUNTER — Other Ambulatory Visit: Payer: Self-pay | Admitting: Nurse Practitioner

## 2024-05-17 MED ORDER — CIPROFLOXACIN HCL 500 MG PO TABS: 500.0000 mg | ORAL_TABLET | Freq: Two times a day (BID) | ORAL | 0 refills | Status: AC

## 2024-05-17 NOTE — TOC Progression Note (Signed)
 Transition of Care [facility]) - Progression Note    Patient Details  Name: Amy Cook MRN: [id number] Date of Birth: [birth year]  Transition of Care [facility]) CM/SW Contact  Sharlyne Stabs, RN Phone Number: 05/17/2024, 1:58 PM  Clinical Narrative:   At 3:09 pm, MD consulting [facility] that patient would now be discharging home. Patient was inpatient, MD got the Code 44 flag, MD started a chat with RN, CM UR to complete Code 44.  CM asked UR to confirm, she was waiting on Dr. Katrine. At 4:22 UR has orders completed. CM updating the team that [facility] will deliver now.  Per RN the patient has already discharged home.  UR asked why was patient allow to leave? RN just stated she had a discharge order. CM updated supervisor that this was a miss that TOC was waiting to deliver and team was all in the chat. CM was advise to document the miss and mail the patient a copy of the OBS/ Code 44 form.  TOC completed.     Expected Discharge Plan: Home/Self Care Barriers to Discharge: Continued Medical Work up    Expected Discharge Plan and Services      Expected Discharge Date: 05/16/24                    Social Drivers of Health (SDOH) Interventions SDOH Screenings   Food Insecurity: No Food Insecurity (05/15/2024)  Housing: Low Risk  (05/15/2024)  Transportation Needs: No Transportation Needs (05/15/2024)  Utilities: Not At Risk (05/15/2024)  Social Connections: Socially Integrated (05/15/2024)  Tobacco Use: Low Risk  (05/15/2024)    Readmission Risk Interventions     No data to display

## 2024-05-17 NOTE — Progress Notes (Signed)
 Insurance denied prior ordered monurol  - macrodantin covered but pt failed as OP - cipro  covered so 3 day course ordered

## 2024-05-20 LAB — CULTURE, BLOOD (ROUTINE X 2)
Culture: NO GROWTH
Culture: NO GROWTH
Special Requests: ADEQUATE

## 2024-05-23 NOTE — Progress Notes (Signed)
 Chief Complaint: Frequent urinary tract infections  History of Present Illness:  Amy Cook is a 68 y.o. female who is seen in consultation from Myra Geni ORN, FNP for evaluation of recurrent UTIs.  She has been treated for and as well as urinary tract infections in the past year.  Typical symptoms include pain with urination, frequency and urgency.  No fevers.  1 of these UTIs, the most recent, ended up with her in the hospital for about a day.  E. coli that grew out was ESBL.     Past Medical History:  Past Medical History:  Diagnosis Date   Arthritis    Cataract 12/2016   both   Hypertension     Past Surgical History:  Past Surgical History:  Procedure Laterality Date   ABDOMINAL HYSTERECTOMY  2001   complete   CESAREAN SECTION     FLEXIBLE SIGMOIDOSCOPY N/A 08/21/2021   Procedure: FLEXIBLE SIGMOIDOSCOPY;  Surgeon: Golda Claudis PENNER, MD;  Location: [facility];  Service: Endoscopy;  Laterality: N/A;    Allergies:  Allergies  Allergen Reactions   Wound Dressing Adhesive Dermatitis and Rash    adhesive   Morphine And Codeine Itching   Camph-Eucalypt-Men-Turp-Pet Dermatitis    Family History:  Family History  Problem Relation Age of Onset   Depression Mother    Hypertension Mother    Hyperlipidemia Mother    Stroke Mother    Alcohol abuse Father    Heart disease Father    Hypertension Father    Hyperlipidemia Father    COPD Sister    Depression Sister    Alcohol abuse Brother    Depression Brother    Alcohol abuse Brother    Depression Brother    Depression Daughter    Alcohol abuse Maternal Uncle    Alcohol abuse Paternal Uncle    Colon cancer Neg Hx     Social History:  Social History   Tobacco Use   Smoking status: Never   Smokeless tobacco: Never  Substance Use Topics   Alcohol use: No   Drug use: No    Review of symptoms:  Constitutional:  Negative for unexplained weight loss, night sweats, fever, chills ENT:  Negative  for nose bleeds, sinus pain, painful swallowing CV:  Negative for chest pain, shortness of breath, exercise intolerance, palpitations, loss of consciousness Resp:  Negative for cough, wheezing, shortness of breath GI:  Negative for nausea, vomiting, diarrhea, bloody stools GU:  Positives noted in HPI; otherwise negative for gross hematuria, dysuria, urinary incontinence Neuro:  Negative for seizures, poor balance, limb weakness, slurred speech Psych:  Negative for lack of energy, depression, anxiety Endocrine:  Negative for polydipsia, polyuria, symptoms of hypoglycemia (dizziness, hunger, sweating) Hematologic:  Negative for anemia, purpura, petechia, prolonged or excessive bleeding, use of anticoagulants  Allergic:  Negative for difficulty breathing or choking as a result of exposure to anything; no shellfish allergy; no allergic response (rash/itch) to materials, foods  Physical exam: There were no vitals taken for this visit. GENERAL APPEARANCE:  Well appearing, well developed, well nourished, NAD HEENT: Atraumatic, Normocephalic. NECK: Normal appearance LUNGS: Normal inspiratory and expiratory excursion HEART: Regular RateEXTREMITIES: Moves all extremities well.  Without clubbing, cyanosis, or edema. NEUROLOGIC:  Alert and oriented x 3, normal gait, CN II-XII grossly intact.  MENTAL STATUS:  Appropriate. SKIN:  Warm, dry and intact.     I have reviewed prior patient's records  I have reviewed referring/prior physicians records--  I have reviewed  urinalysis  Bladder scan volume is 0  I have reviewed prior urine cultures  Assessment: - Recurrent urinary tract infection, relatively uncomplicated  - Vaginal atrophic changes  Plan:  - She is already on methenamine  given to her by the hospital doctor and [facility].  She will continue to take the methenamine  hippurate 1 g twice daily long-term  - Start her on perivaginal Estrace cream well 2-3 nights a week  - I strongly  recommended that if she feels like she has a UTI to at least drop specimen off in our [place] office  - I will have her come back in 3 months to recheck

## 2024-05-24 ENCOUNTER — Ambulatory Visit (INDEPENDENT_AMBULATORY_CARE_PROVIDER_SITE_OTHER): Admitting: Urology

## 2024-05-24 VITALS — BP 123/71 | HR 86 | Ht 63.0 in | Wt 164.0 lb

## 2024-05-24 DIAGNOSIS — N390 Urinary tract infection, site not specified: Secondary | ICD-10-CM

## 2024-05-24 DIAGNOSIS — Z87440 Personal history of urinary (tract) infections: Secondary | ICD-10-CM

## 2024-05-24 DIAGNOSIS — N952 Postmenopausal atrophic vaginitis: Secondary | ICD-10-CM | POA: Diagnosis not present

## 2024-05-24 LAB — URINALYSIS, ROUTINE W REFLEX MICROSCOPIC
Bilirubin, UA: NEGATIVE
Glucose, UA: NEGATIVE
Ketones, UA: NEGATIVE
Leukocytes,UA: NEGATIVE
Nitrite, UA: NEGATIVE
Protein,UA: NEGATIVE
RBC, UA: NEGATIVE
Specific Gravity, UA: 1.01 (ref 1.005–1.030)
Urobilinogen, Ur: 0.2 mg/dL (ref 0.2–1.0)
pH, UA: 5.5 (ref 5.0–7.5)

## 2024-05-24 LAB — BLADDER SCAN AMB NON-IMAGING: Scan Result: 0

## 2024-05-24 MED ORDER — METHENAMINE HIPPURATE 1 G PO TABS: 1.0000 g | ORAL_TABLET | Freq: Two times a day (BID) | ORAL | 11 refills | Status: AC

## 2024-05-24 MED ORDER — ESTRADIOL 0.1 MG/GM VA CREA: TOPICAL_CREAM | VAGINAL | 3 refills | Status: AC

## 2024-06-26 ENCOUNTER — Telehealth: Payer: Self-pay

## 2024-06-26 NOTE — Telephone Encounter (Signed)
 Pt states she seen MD Dahlstedt in [place] and he gave her a vaginal cream and Hiprex , pt states that while applying vaginal cream she feels swollen and she has a hard time having sex with her husband pt has made a appointment to the obgyn for 11/20 pt wants to know if she needs any type of imaging pt stated in 2001 she had a hysterectomy and has not been seen by obgyn since

## 2024-06-27 NOTE — Telephone Encounter (Signed)
 Called and made the pt aware of MD recommendations pt voiced her understanding

## 2024-06-28 ENCOUNTER — Ambulatory Visit: Admitting: Urology

## 2024-07-13 ENCOUNTER — Ambulatory Visit (INDEPENDENT_AMBULATORY_CARE_PROVIDER_SITE_OTHER): Admitting: Obstetrics & Gynecology

## 2024-07-13 ENCOUNTER — Encounter: Payer: Self-pay | Admitting: Obstetrics & Gynecology

## 2024-07-13 VITALS — BP 130/83 | HR 74 | Ht 63.5 in | Wt 163.6 lb

## 2024-07-13 DIAGNOSIS — N390 Urinary tract infection, site not specified: Secondary | ICD-10-CM

## 2024-07-13 DIAGNOSIS — N952 Postmenopausal atrophic vaginitis: Secondary | ICD-10-CM

## 2024-07-13 NOTE — Progress Notes (Signed)
   GYN VISIT Patient name: Amy Cook MRN [id number]  Date of birth: November 30, 1955 Chief Complaint:   Frequent UTI  History of Present Illness:   Amy Cook is a 68 y.o. PM, PH female being seen today for the following concerns:  Notes that always after having IC- she will get a UTI.  Seen by urologist, started on vaginal estrogen, but not using currently.  She was not sure if something else was wrong or if something else could be done.  Additionally, when she puts the cream inside, she felt something floppy and wanted to be checked out.    No LMP recorded. Patient has had a hysterectomy.    Review of Systems:   Pertinent items are noted in HPI Denies fever/chills, dizziness, headaches, visual disturbances, fatigue, shortness of breath, chest pain, abdominal pain, vomiting Pertinent History Reviewed:   Past Surgical History:  Procedure Laterality Date   ABDOMINAL HYSTERECTOMY  2001   complete   CESAREAN SECTION     FLEXIBLE SIGMOIDOSCOPY N/A 08/21/2021   Procedure: FLEXIBLE SIGMOIDOSCOPY;  Surgeon: Golda Claudis PENNER, MD;  Location: [facility];  Service: Endoscopy;  Laterality: N/A;   REPLACEMENT TOTAL HIP W/  RESURFACING IMPLANTS Bilateral     Past Medical History:  Diagnosis Date   Arthritis    Cataract 12/2016   both   Hypertension    Reviewed problem list, medications and allergies. Physical Assessment:   Vitals:   07/13/24 1327  BP: 130/83  Pulse: 74  Weight: 163 lb 9.6 oz (74.2 kg)  Height: 5' 3.5 (1.613 m)  Body mass index is 28.53 kg/m.       Physical Examination:   General appearance: alert, well appearing, and in no distress  Psych: mood appropriate, normal affect  Skin: warm & dry   Cardiovascular: normal heart rate noted  Respiratory: normal respiratory effort, no distress  Abdomen: soft, non-tender, no rebound or guarding  Pelvic: VULVA: normal appearing vulva with no masses, tenderness or lesions, VAGINA: Atrophic mucosa, no  discharge no lesions.  Vaginal cuff well-healed, no abnormalities noted.  No pelvic organ prolapse appreciated  Extremities: no edema   Chaperone: Aleck Blase    Assessment & Plan:  1) Recurrent UTI, Vaginal atrophy - Reassured patient that no abnormalities were noted on exam - Patient to use vaginal estrogen twice weekly - Reassured patient that frequent UTIs due to vaginal atrophy is a common occurrence - Also discussed OTC vaginal moisturizers - Questions and concerns were addressed - Patient to follow-up as needed   No orders of the defined types were placed in this encounter.   Return if symptoms worsen or fail to improve.   Djuna Frechette, DO Attending Obstetrician & Gynecologist, [facility] for [employer], [facility]

## 2024-08-28 NOTE — Progress Notes (Signed)
" ° °  Assessment: - Recurrent urinary tract infection, relatively uncomplicated.  No recent infections  - Vaginal atrophic changes.  This is bothersome.  Plan: -As she is having some pain with penetration, I recommend that she get vaginal dilators and use these regularly.  Otherwise, would tell her to avoid vaginal penetration with sex  - She will continue her methenamine  once a day, continue estrogen cream long-term  - Office visit in 1 year   History of Present Illness:  10.1.2025:  seen in consultation from Myra Geni ORN, FNP for evaluation of recurrent UTIs.  She has been treated for and as well as urinary tract infections in the past year.  Typical symptoms include pain with urination, frequency and urgency.  No fevers.  1 of these UTIs, the most recent, ended up with her in the hospital for about a day.  E. coli that grew out was ESBL.  1.6.2026: Here today for routine check.  No recent UTIs.  Did have voiding discomfort after vaginal intercourse last month.  That abated after a day or 2.  No current lower urinary tract symptoms.  Penetration is uncomfortable.   Past Medical History:  Past Medical History:  Diagnosis Date   Arthritis    Cataract 12/2016   both   Hypertension     Past Surgical History:  Past Surgical History:  Procedure Laterality Date   ABDOMINAL HYSTERECTOMY  2001   complete   CESAREAN SECTION     FLEXIBLE SIGMOIDOSCOPY N/A 08/21/2021   Procedure: FLEXIBLE SIGMOIDOSCOPY;  Surgeon: Golda Claudis PENNER, MD;  Location: [facility];  Service: Endoscopy;  Laterality: N/A;   REPLACEMENT TOTAL HIP W/  RESURFACING IMPLANTS Bilateral     Allergies:  Allergies  Allergen Reactions   Wound Dressing Adhesive Dermatitis and Rash    adhesive   Morphine And Codeine Itching   Camph-Eucalypt-Men-Turp-Pet Dermatitis    Family History:  Family History  Problem Relation Age of Onset   Depression Mother    Hypertension Mother    Hyperlipidemia Mother    Stroke  Mother    Alcohol abuse Father    Heart disease Father    Hypertension Father    Hyperlipidemia Father    COPD Sister    Depression Sister    Alcohol abuse Brother    Depression Brother    Alcohol abuse Brother    Depression Brother    Depression Daughter    Alcohol abuse Maternal Uncle    Alcohol abuse Paternal Uncle    Colon cancer Neg Hx     Social History:  Social History   Tobacco Use   Smoking status: Never   Smokeless tobacco: Never  Substance Use Topics   Alcohol use: No   Drug use: No       I have reviewed prior patient's records  I have reviewed urinalysis--clear     "

## 2024-08-29 ENCOUNTER — Ambulatory Visit: Admitting: Urology

## 2024-08-29 VITALS — BP 128/74 | HR 72

## 2024-08-29 DIAGNOSIS — N952 Postmenopausal atrophic vaginitis: Secondary | ICD-10-CM | POA: Diagnosis not present

## 2024-08-29 DIAGNOSIS — N390 Urinary tract infection, site not specified: Secondary | ICD-10-CM

## 2024-08-29 DIAGNOSIS — Z87440 Personal history of urinary (tract) infections: Secondary | ICD-10-CM | POA: Diagnosis not present

## 2024-08-29 LAB — URINALYSIS, ROUTINE W REFLEX MICROSCOPIC
Bilirubin, UA: NEGATIVE
Glucose, UA: NEGATIVE
Ketones, UA: NEGATIVE
Leukocytes,UA: NEGATIVE
Nitrite, UA: NEGATIVE
Protein,UA: NEGATIVE
RBC, UA: NEGATIVE
Specific Gravity, UA: <1.005 — ABNORMAL LOW (ref 1.005–1.030)
Urobilinogen, Ur: 0.2 mg/dL (ref 0.2–1.0)
pH, UA: 7 (ref 5.0–7.5)
